# Patient Record
Sex: Male | Born: 1990
Health system: Southern US, Community
[De-identification: ages and names within clinical notes are randomized; demographics above are authoritative.]

## PROBLEM LIST (undated history)

## (undated) DIAGNOSIS — E119 Type 2 diabetes mellitus without complications: Secondary | ICD-10-CM

## (undated) DIAGNOSIS — I10 Essential (primary) hypertension: Secondary | ICD-10-CM

## (undated) DIAGNOSIS — M109 Gout, unspecified: Secondary | ICD-10-CM

## (undated) HISTORY — PX: TONSILLECTOMY: SUR1361

## (undated) HISTORY — PX: BONE CURRETTAGE W/ BONE GRAFT: SHX1248

## (undated) HISTORY — DX: Gout, unspecified: M10.9

## (undated) HISTORY — DX: Type 2 diabetes mellitus without complications: E11.9

---
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
## 2011-12-01 ENCOUNTER — Encounter: Payer: Self-pay | Admitting: *Deleted

## 2011-12-01 ENCOUNTER — Emergency Department (HOSPITAL_COMMUNITY): Payer: BC Managed Care – PPO

## 2011-12-01 ENCOUNTER — Emergency Department (HOSPITAL_COMMUNITY)
Admission: EM | Admit: 2011-12-01 | Discharge: 2011-12-02 | Disposition: A | Discharge status: Home / Self Care | Payer: BC Managed Care – PPO | Attending: Emergency Medicine | Admitting: Emergency Medicine

## 2011-12-01 DIAGNOSIS — M79609 Pain in unspecified limb: Secondary | ICD-10-CM | POA: Insufficient documentation

## 2011-12-01 DIAGNOSIS — S93409A Sprain of unspecified ligament of unspecified ankle, initial encounter: Secondary | ICD-10-CM

## 2011-12-01 DIAGNOSIS — IMO0002 Reserved for concepts with insufficient information to code with codable children: Secondary | ICD-10-CM | POA: Insufficient documentation

## 2011-12-01 DIAGNOSIS — Y9367 Activity, basketball: Secondary | ICD-10-CM | POA: Insufficient documentation

## 2011-12-01 NOTE — ED Notes (Signed)
Pt in c/o left foot pain after injury

## 2011-12-02 NOTE — ED Notes (Signed)
Pt given discharge instructions and verbalizes understanding  

## 2011-12-02 NOTE — ED Provider Notes (Signed)
History     CSN: [id number] Arrival date & time: 12/01/2011 10:08 PM   First MD Initiated Contact with Patient 12/02/11 0115      Chief Complaint  Patient presents with  . Foot Pain    (Consider location/radiation/quality/duration/timing/severity/associated sxs/prior treatment) HPI Comments: Patient was playing basketball today and somebody stepped on his foot and he fell the opposite direction with his ankle twisting. He's had pain and swelling on the lateral part of his left foot since. The pain is a 6/10. He is able to walk with a limp.  Patient is a 20 y.o. male presenting with lower extremity pain. The history is provided by the patient.  Foot Pain This is a new problem. The current episode started 3 to 5 hours ago. The problem occurs constantly. The problem has been gradually improving. The symptoms are aggravated by walking and standing. The symptoms are relieved by rest and position. He has tried nothing for the symptoms. The treatment provided mild relief.    History reviewed. No pertinent past medical history.  History reviewed. No pertinent past surgical history.  History reviewed. No pertinent family history.  History  Substance Use Topics  . Smoking status: Not on file  . Smokeless tobacco: Not on file  . Alcohol Use: Not on file      Review of Systems  All other systems reviewed and are negative.    Allergies  Review of patient's allergies indicates no known allergies.  Home Medications  No current outpatient prescriptions on file.  BP 152/97  Pulse 87  Temp(Src) 99.1 F (37.3 C) (Oral)  Resp 20  SpO2 96%  Physical Exam  Nursing note and vitals reviewed. Constitutional: He is oriented to person, place, and time. He appears well-developed and well-nourished. No distress.  HENT:  Head: Normocephalic and atraumatic.  Eyes: EOM are normal. Pupils are equal, round, and reactive to light.  Musculoskeletal:       Left ankle: He exhibits swelling.  He exhibits normal range of motion. tenderness. Lateral malleolus tenderness found. No head of 5th metatarsal and no proximal fibula tenderness found.       Feet:  Neurological: He is alert and oriented to person, place, and time.  Skin: Skin is warm and dry.  Psychiatric: He has a normal mood and affect. His behavior is normal.    ED Course  Procedures (including critical care time)  Labs Reviewed - No data to display Dg Foot Complete Left  12/01/2011  *RADIOLOGY REPORT*  Clinical Data: Injured left foot playing basketball  LEFT FOOT - COMPLETE 3+ VIEW  Comparison: None.  Findings: No fracture or dislocation is seen.  The joint spaces are preserved.  The visualized soft tissues are unremarkable.  IMPRESSION: No acute osseous abnormality is seen.  Original Report Authenticated By: Charline Bills, M.D.     No diagnosis found.    MDM   Patient with mechanical injury to the left ankle with some mild swelling on the lateral malleolar. Plain film negative for fracture. Will place an air splint and have him followup for worsening symptoms. Patient is able to bear weight with only a mild limp.        Gwyneth Sprout, MD 12/02/11 [phone]

## 2013-09-10 ENCOUNTER — Encounter (HOSPITAL_COMMUNITY): Payer: Self-pay | Admitting: *Deleted

## 2013-09-10 ENCOUNTER — Emergency Department (HOSPITAL_COMMUNITY)
Admission: EM | Admit: 2013-09-10 | Discharge: 2013-09-10 | Disposition: A | Discharge status: Home / Self Care | Payer: BC Managed Care – PPO | Attending: Emergency Medicine | Admitting: Emergency Medicine

## 2013-09-10 DIAGNOSIS — Y9239 Other specified sports and athletic area as the place of occurrence of the external cause: Secondary | ICD-10-CM | POA: Insufficient documentation

## 2013-09-10 DIAGNOSIS — S060X0A Concussion without loss of consciousness, initial encounter: Secondary | ICD-10-CM | POA: Insufficient documentation

## 2013-09-10 DIAGNOSIS — W219XXA Striking against or struck by unspecified sports equipment, initial encounter: Secondary | ICD-10-CM | POA: Insufficient documentation

## 2013-09-10 DIAGNOSIS — Y9361 Activity, american tackle football: Secondary | ICD-10-CM | POA: Insufficient documentation

## 2013-09-10 MED ORDER — IBUPROFEN 800 MG PO TABS
800.0000 mg | ORAL_TABLET | Freq: Once | ORAL | Status: AC
  Administered 2013-09-10: 800 mg via ORAL
  Filled 2013-09-10: qty 1

## 2013-09-10 NOTE — ED Provider Notes (Signed)
CSN: [id number]     Arrival date & time 09/10/13  2033 History  This chart was scribed for non-physician practitioner Ivonne Andrew, working with Nelia Shi, MD by Carl Best, ED Scribe. This patient was seen in room WTR8/WTR8 and the patient's care was started at 10:25 PM.    Chief Complaint  Patient presents with  . Head Injury    Patient is a 22 y.o. male presenting with head injury. The history is provided by the patient. No language interpreter was used.  Head Injury Associated symptoms: headache   Associated symptoms: no numbness    HPI Comments: Marco Torres is a 22 y.o. male who presents to the Emergency Department complaining of an intermittent headache located on his left temple.  He states that he "caught an elbow" to the left side of his head yesterday while playing football.  The patient denies loosing consciousness at the time of the incident.  The patient states that the left side of his head is swollen and the back of his head hurts.  He states that turning his head to the left aggravates the pain.  The patient states he feels slightly dizzy.  He denies weakness or numbness in his hands or feet, vision problems, trouble concentrating, confusion, and increased short term memory loss as associated symptoms. He denies taking any medication for his headache.  He denies having a history of concussions or head injuries.  History reviewed. No pertinent past medical history. Past Surgical History  Procedure Laterality Date  . Tonsillectomy     No family history on file. History  Substance Use Topics  . Smoking status: Never Smoker   . Smokeless tobacco: Not on file  . Alcohol Use: Yes    Review of Systems  Neurological: Positive for dizziness (slightly) and headaches. Negative for weakness, light-headedness and numbness.  Psychiatric/Behavioral: Negative for confusion and decreased concentration.  All other systems reviewed and are negative.    Allergies  Review  of patient's allergies indicates no known allergies.  Home Medications  No current outpatient prescriptions on file.  Triage Vitals: BP 155/93  Pulse 84  Temp(Src) 98.1 F (36.7 C) (Oral)  Wt 340 lb (154.223 kg)  SpO2 96%  Physical Exam  Nursing note and vitals reviewed. Constitutional: He is oriented to person, place, and time. He appears well-developed. No distress.  HENT:  Head: Normocephalic and atraumatic.  Right Ear: External ear normal.  Left Ear: External ear normal.  Nose: Nose normal.  Mouth/Throat: Oropharynx is clear and moist.  No Battle sign or raccoon eyes, tenderness along TMJ  Eyes: Conjunctivae and EOM are normal. Pupils are equal, round, and reactive to light.  Neck: Neck supple.  Cardiovascular: Normal rate, regular rhythm, normal heart sounds and intact distal pulses.   Pulmonary/Chest: Effort normal and breath sounds normal. No respiratory distress.  Musculoskeletal: Normal range of motion. He exhibits no edema and no tenderness.  Non tender to palpation along the cervical spine, TTP along the trapezius, no skull depressions or step-offs   Neurological: He is alert and oriented to person, place, and time.  Normal strength.  Skin: Skin is warm and dry. He is not diaphoretic.  Psychiatric: He has a normal mood and affect.    ED Course  Procedures   DIAGNOSTIC STUDIES: Oxygen Saturation is 96% on room air, adequate by my interpretation.    COORDINATION OF CARE: 10:41 PM- Discussed administering pain medication in the ED.  Advised the patient to take Ibuprofen or  Tylenol and relax to alleviate the headache and gradually return to playing sports.  The patient agreed to the treatment plan.    Medications  ibuprofen (ADVIL,MOTRIN) tablet 800 mg (not administered)      MDM   1. Headache   2. Concussion without loss of consciousness, initial encounter    Patient seen and evaluated. Patient well with normal nonfocal neuro exam. No signs of significant  trauma to the head or face. Patient may have continued headache from mild concussion symptoms.  I personally performed the services described in this documentation, which was scribed in my presence. The recorded information has been reviewed and is accurate.   Angus Seller, PA-C 09/11/13 [phone]

## 2013-09-10 NOTE — ED Notes (Signed)
Playing football yesterday, "caught elbow to left side of head" No LOC, Intermittent throbbing on and off with pain. Pain shoots at times

## 2013-09-22 NOTE — ED Provider Notes (Signed)
Medical screening examination/treatment/procedure(s) were performed by non-physician practitioner and as supervising physician I was immediately available for consultation/collaboration.    Nelia Shi, MD 09/22/13 [phone]

## 2013-10-06 ENCOUNTER — Other Ambulatory Visit: Payer: Self-pay | Admitting: Internal Medicine

## 2013-10-10 ENCOUNTER — Other Ambulatory Visit: Payer: BC Managed Care – PPO

## 2013-10-16 ENCOUNTER — Other Ambulatory Visit: Payer: BC Managed Care – PPO

## 2013-10-18 ENCOUNTER — Ambulatory Visit
Admission: RE | Admit: 2013-10-18 | Discharge: 2013-10-18 | Disposition: A | Discharge status: Home / Self Care | Payer: BC Managed Care – PPO | Source: Ambulatory Visit | Attending: Internal Medicine | Admitting: Internal Medicine

## 2013-10-18 ENCOUNTER — Other Ambulatory Visit: Payer: Self-pay | Admitting: Internal Medicine

## 2015-11-10 ENCOUNTER — Emergency Department (HOSPITAL_COMMUNITY)
Admission: EM | Admit: 2015-11-10 | Discharge: 2015-11-10 | Disposition: A | Discharge status: Home / Self Care | Payer: 59 | Attending: Emergency Medicine | Admitting: Emergency Medicine

## 2015-11-10 ENCOUNTER — Encounter (HOSPITAL_COMMUNITY): Payer: Self-pay | Admitting: *Deleted

## 2015-11-10 DIAGNOSIS — H9201 Otalgia, right ear: Secondary | ICD-10-CM | POA: Diagnosis present

## 2015-11-10 DIAGNOSIS — H6691 Otitis media, unspecified, right ear: Secondary | ICD-10-CM | POA: Insufficient documentation

## 2015-11-10 DIAGNOSIS — Z79899 Other long term (current) drug therapy: Secondary | ICD-10-CM | POA: Diagnosis not present

## 2015-11-10 MED ORDER — AMOXICILLIN 500 MG PO TABS: 500.0000 mg | ORAL_TABLET | Freq: Two times a day (BID) | ORAL | Status: AC

## 2015-11-10 MED ORDER — IBUPROFEN 600 MG PO TABS: 600.0000 mg | ORAL_TABLET | Freq: Four times a day (QID) | ORAL | Status: DC | PRN

## 2015-11-10 MED ORDER — IBUPROFEN 200 MG PO TABS
600.0000 mg | ORAL_TABLET | Freq: Once | ORAL | Status: AC
  Administered 2015-11-10: 600 mg via ORAL
  Filled 2015-11-10: qty 3

## 2015-11-10 NOTE — ED Provider Notes (Signed)
CSN: [id number]     Arrival date & time 11/10/15  0146 History   First MD Initiated Contact with Patient 11/10/15 0602     Chief Complaint  Patient presents with  . Otalgia     (Consider location/radiation/quality/duration/timing/severity/associated sxs/prior Treatment) HPI Comments: SUBJECTIVE: Marco Torres is a 24 y.o. male brought by mother with 1 day(s) history of pain and pulling at right ear, and congestion, post nasal drip and right ear no fever, no drainage, no hearing loss or ringing in the ear. Temperature not measured at home. No trismus. No toothache.    Patient is a 24 y.o. male presenting with ear pain. The history is provided by the patient.  Otalgia Associated symptoms: no hearing loss     History reviewed. No pertinent past medical history. Past Surgical History  Procedure Laterality Date  . Tonsillectomy     No family history on file. Social History  Substance Use Topics  . Smoking status: Never Smoker   . Smokeless tobacco: None  . Alcohol Use: Yes    Review of Systems  HENT: Positive for ear pain. Negative for facial swelling, hearing loss and nosebleeds.       Allergies  Review of patient's allergies indicates no known allergies.  Home Medications   Prior to Admission medications   Medication Sig Start Date End Date Taking? Authorizing Provider  Multiple Vitamins-Minerals (EMERGEN-C VITAMIN C PO) Take 1 packet by mouth daily.   Yes Historical Provider, MD  amoxicillin (AMOXIL) 500 MG tablet Take 1 tablet (500 mg total) by mouth 2 (two) times daily. 11/10/15   Derwood KaplanAnkit Kendalynn Wideman, MD  ibuprofen (ADVIL,MOTRIN) 600 MG tablet Take 1 tablet (600 mg total) by mouth every 6 (six) hours as needed. 11/10/15   Renesmae Donahey, MD   BP 157/96 mmHg  Pulse 90  Temp(Src) 98.4 F (36.9 C) (Oral)  Resp 16  SpO2 98% Physical Exam  Constitutional: He is oriented to person, place, and time. He appears well-developed.  HENT:  Head: Atraumatic.  Mouth/Throat:  No oropharyngeal exudate.  The ear exam is limited due to the topical drops patient placed. However, there is no pre/post auricular lymphadenopathy and there is mild erythema in the canal.  Neck: Neck supple.  Cardiovascular: Normal rate.   Pulmonary/Chest: Effort normal.  Neurological: He is alert and oriented to person, place, and time.  Skin: Skin is warm.    ED Course  Procedures (including critical care time) Labs Review Labs Reviewed - No data to display  Imaging Review No results found. I have personally reviewed and evaluated these images and lab results as part of my medical decision-making.   EKG Interpretation None      MDM   Final diagnoses:  Acute right otitis media, recurrence not specified, unspecified otitis media type    ASSESSMENT: Otitis Media  PLAN: 1) See orders for this visit as documented in the electronic medical record. 2) Symptomatic therapy suggested: use ibuprofen prn.  3) Call or return to clinic prn if these symptoms worsen or fail to improve as anticipated.   Derwood KaplanAnkit Aliza Moret, MD 11/10/15 [phone]

## 2015-11-10 NOTE — ED Notes (Signed)
Pt reports he feels like he has pressure in Rt ear that started this evening around 7pm.

## 2015-11-10 NOTE — Discharge Instructions (Signed)
Take the antibiotics only if the pain is not getting better or you have fevers. Otherwise, take the motrin.  Please return to the ER if your symptoms worsen; you have increased pain, confusion, headaches. Otherwise see the outpatient doctor as requested.   Otitis Media, Adult Otitis media is redness, soreness, and inflammation of the middle ear. Otitis media may be caused by allergies or, most commonly, by infection. Often it occurs as a complication of the common cold. SIGNS AND SYMPTOMS Symptoms of otitis media may include:  Earache.  Fever.  Ringing in your ear.  Headache.  Leakage of fluid from the ear. DIAGNOSIS To diagnose otitis media, your health care provider will examine your ear with an otoscope. This is an instrument that allows your health care provider to see into your ear in order to examine your eardrum. Your health care provider also will ask you questions about your symptoms. TREATMENT  Typically, otitis media resolves on its own within 3-5 days. Your health care provider may prescribe medicine to ease your symptoms of pain. If otitis media does not resolve within 5 days or is recurrent, your health care provider may prescribe antibiotic medicines if he or she suspects that a bacterial infection is the cause. HOME CARE INSTRUCTIONS   If you were prescribed an antibiotic medicine, finish it all even if you start to feel better.  Take medicines only as directed by your health care provider.  Keep all follow-up visits as directed by your health care provider. SEEK MEDICAL CARE IF:  You have otitis media only in one ear, or bleeding from your nose, or both.  You notice a lump on your neck.  You are not getting better in 3-5 days.  You feel worse instead of better. SEEK IMMEDIATE MEDICAL CARE IF:   You have pain that is not controlled with medicine.  You have swelling, redness, or pain around your ear or stiffness in your neck.  You notice that part of your  face is paralyzed.  You notice that the bone behind your ear (mastoid) is tender when you touch it. MAKE SURE YOU:   Understand these instructions.  Will watch your condition.  Will get help right away if you are not doing well or get worse.   This information is not intended to replace advice given to you by your health care provider. Make sure you discuss any questions you have with your health care provider.   Document Released: 09/04/2004 Document Revised: 12/21/2014 Document Reviewed: 06/27/2013 Elsevier Interactive Patient Education [employer]2016 [employer]

## 2016-04-02 ENCOUNTER — Ambulatory Visit: Payer: 59 | Admitting: Podiatry

## 2016-04-21 ENCOUNTER — Ambulatory Visit: Payer: 59 | Admitting: Podiatry

## 2016-04-28 ENCOUNTER — Ambulatory Visit: Payer: 59 | Admitting: Podiatry

## 2016-05-14 ENCOUNTER — Ambulatory Visit: Payer: 59 | Admitting: Podiatry

## 2016-11-21 DIAGNOSIS — I10 Essential (primary) hypertension: Secondary | ICD-10-CM | POA: Insufficient documentation

## 2017-08-25 ENCOUNTER — Encounter (HOSPITAL_COMMUNITY): Payer: Self-pay

## 2017-08-25 ENCOUNTER — Emergency Department (HOSPITAL_COMMUNITY)
Admission: EM | Admit: 2017-08-25 | Discharge: 2017-08-25 | Disposition: A | Discharge status: Home / Self Care | Payer: BLUE CROSS/BLUE SHIELD | Attending: Emergency Medicine | Admitting: Emergency Medicine

## 2017-08-25 DIAGNOSIS — H6122 Impacted cerumen, left ear: Secondary | ICD-10-CM | POA: Insufficient documentation

## 2017-08-25 DIAGNOSIS — H6592 Unspecified nonsuppurative otitis media, left ear: Secondary | ICD-10-CM

## 2017-08-25 DIAGNOSIS — H748X3 Other specified disorders of middle ear and mastoid, bilateral: Secondary | ICD-10-CM | POA: Diagnosis not present

## 2017-08-25 DIAGNOSIS — H9202 Otalgia, left ear: Secondary | ICD-10-CM | POA: Diagnosis present

## 2017-08-25 MED ORDER — TRIAMCINOLONE ACETONIDE 55 MCG/ACT NA AERO: 2.0000 | INHALATION_SPRAY | Freq: Every day | NASAL | 12 refills | Status: AC

## 2017-08-25 MED ORDER — IBUPROFEN 800 MG PO TABS
800.0000 mg | ORAL_TABLET | Freq: Once | ORAL | Status: AC
  Administered 2017-08-25: 800 mg via ORAL
  Filled 2017-08-25: qty 1

## 2017-08-25 MED ORDER — IBUPROFEN 600 MG PO TABS: 600.0000 mg | ORAL_TABLET | Freq: Four times a day (QID) | ORAL | 0 refills | Status: DC | PRN

## 2017-08-25 MED ORDER — CETIRIZINE HCL 10 MG PO CAPS: 10.0000 mg | ORAL_CAPSULE | Freq: Every day | ORAL | 0 refills | Status: AC

## 2017-08-25 NOTE — ED Triage Notes (Addendum)
Pt reporting L sided otalgia that started around 830 pm last night. He states that the pain radiates down and around his jaw as well.  He denies N/V/D. A&Ox4. Ambulatory. Pt sounds nasally congested throughout triage.

## 2017-08-25 NOTE — ED Notes (Signed)
Bed: [facility] Expected date:  Expected time:  Means of arrival:  Comments: 

## 2017-08-25 NOTE — ED Provider Notes (Addendum)
[facility]-EMERGENCY DEPT Provider Note   CSN: [id number] Arrival date & time: 08/25/17  0251     History   Chief Complaint Chief Complaint  Patient presents with  . Otalgia    L    HPI Marco Torres is a 26 y.o. male.  Patient presents with gradual onset of left ear pain last night. No drainage or bleeding from the ear. No recurrent ear infections. No fever, nausea, dizziness, sore throat. No foreign bodies inserted into the ear prior to onset of pain. He denies new nasal congestion or sinus pressure. He reports he has those symptoms chronically.   The history is provided by the patient. No language interpreter was used.    History reviewed. No pertinent past medical history.  There are no active problems to display for this patient.   Past Surgical History:  Procedure Laterality Date  . TONSILLECTOMY         Home Medications    Prior to Admission medications   Medication Sig Start Date End Date Taking? Authorizing Provider  amoxicillin (AMOXIL) 500 MG tablet Take 1 tablet (500 mg total) by mouth 2 (two) times daily. 11/10/15   Derwood Kaplan, MD  ibuprofen (ADVIL,MOTRIN) 600 MG tablet Take 1 tablet (600 mg total) by mouth every 6 (six) hours as needed. 11/10/15   Derwood Kaplan, MD  Multiple Vitamins-Minerals (EMERGEN-C VITAMIN C PO) Take 1 packet by mouth daily.    [provider]    Family History History reviewed. No pertinent family history.  Social History Social History  Substance Use Topics  . Smoking status: Never Smoker  . Smokeless tobacco: Not on file  . Alcohol use Yes     Allergies   Patient has no known allergies.   Review of Systems Review of Systems  Constitutional: Negative for fever.  HENT: Positive for congestion and ear pain. Negative for ear discharge, sore throat, trouble swallowing and voice change.   Respiratory: Negative for cough.   Gastrointestinal: Negative for nausea.  Neurological: Negative for dizziness.      Physical Exam Updated Vital Signs BP (!) 163/90 (BP Location: Right Arm)   Pulse (!) 106   Temp 98.1 F (36.7 C) (Oral)   Resp 18   SpO2 96%   Physical Exam  Constitutional: He is oriented to person, place, and time. He appears well-developed and well-nourished.  HENT:  Mouth/Throat: Oropharynx is clear and moist.  Right ear and ear canal are unremarkable. Left ear canal occluded with cerumen.  Post ear lavage of left ear: canal is red and minimally swollen. Middle ear effusion present without significant redness of the TM.  Neck: Normal range of motion.  Pulmonary/Chest: Effort normal.  Musculoskeletal: Normal range of motion.  Neurological: He is alert and oriented to person, place, and time.  Skin: Skin is warm and dry.  Psychiatric: He has a normal mood and affect.     ED Treatments / Results  Labs (all labs ordered are listed, but only abnormal results are displayed) Labs Reviewed - No data to display  EKG  EKG Interpretation None       Radiology No results found.  Procedures .Ear Cerumen Removal Date/Time: 09/06/2017 10:32 PM Performed by: Elpidio Anis Authorized by: Elpidio Anis   Consent:    Consent obtained:  Verbal   Consent given by:  Patient Procedure details:    Location:  L ear   Procedure type: irrigation   Post-procedure details:    Hearing quality:  Improved  Patient tolerance of procedure:  Tolerated well, no immediate complications   (including critical care time)  Medications Ordered in ED Medications - No data to display   Initial Impression / Assessment and Plan / ED Course  I have reviewed the triage vital signs and the nursing notes.  Pertinent labs & imaging results that were available during my care of the patient were reviewed by me and considered in my medical decision making (see chart for details).     Patient with ear pain that started last night. On exam, post-lavage, there is an effusion without evidence  infection. No fever, redness. He has chronic sinus and nasal congestion.   Will start on Zyrtec, Nasacort and recommend plain saline nasal spray. Follow up with PCP is pain persists.   Final Clinical Impressions(s) / ED Diagnoses   Final diagnoses:  None   1. Left middle ear effusion 2. Left cerumen impaction  New Prescriptions New Prescriptions   No medications on file     Elpidio AnisUpstill, Charma Mocarski, Cordelia Poche-C 08/25/17 [id number]    Ward, Layla MawKristen N, DO 08/25/17 0535    Elpidio AnisUpstill, Naydene Kamrowski, PA-C 09/06/17 2232    Ward, Layla MawKristen N, DO 09/09/17 0004

## 2017-08-25 NOTE — Discharge Instructions (Signed)
Recommend daily use of Zyrtec and nasacort. Also recommend plain saline nasal spray and ibuprofen as directed.

## 2017-09-04 ENCOUNTER — Encounter (HOSPITAL_BASED_OUTPATIENT_CLINIC_OR_DEPARTMENT_OTHER): Payer: Self-pay | Admitting: *Deleted

## 2017-09-04 ENCOUNTER — Emergency Department (HOSPITAL_BASED_OUTPATIENT_CLINIC_OR_DEPARTMENT_OTHER)
Admission: EM | Admit: 2017-09-04 | Discharge: 2017-09-04 | Disposition: A | Discharge status: Home / Self Care | Payer: BLUE CROSS/BLUE SHIELD | Attending: Emergency Medicine | Admitting: Emergency Medicine

## 2017-09-04 DIAGNOSIS — R0981 Nasal congestion: Secondary | ICD-10-CM | POA: Diagnosis not present

## 2017-09-04 DIAGNOSIS — Z79899 Other long term (current) drug therapy: Secondary | ICD-10-CM | POA: Insufficient documentation

## 2017-09-04 DIAGNOSIS — H9202 Otalgia, left ear: Secondary | ICD-10-CM

## 2017-09-04 MED ORDER — FLUTICASONE PROPIONATE 50 MCG/ACT NA SUSP: 2.0000 | Freq: Every day | NASAL | 0 refills | Status: AC

## 2017-09-04 MED ORDER — IBUPROFEN 800 MG PO TABS
800.0000 mg | ORAL_TABLET | Freq: Once | ORAL | Status: AC
  Administered 2017-09-04: 800 mg via ORAL
  Filled 2017-09-04: qty 1

## 2017-09-04 MED ORDER — OXYMETAZOLINE HCL 0.05 % NA SOLN
1.0000 | Freq: Once | NASAL | Status: AC
  Administered 2017-09-04: 1 via NASAL
  Filled 2017-09-04: qty 15

## 2017-09-04 MED ORDER — IBUPROFEN 400 MG PO TABS: 400.0000 mg | ORAL_TABLET | Freq: Four times a day (QID) | ORAL | 0 refills | Status: DC | PRN

## 2017-09-04 NOTE — ED Triage Notes (Signed)
C/o left ear pain x 4 hours,  Has been using drops that was given 10 days ago

## 2017-09-04 NOTE — ED Provider Notes (Addendum)
[facility] Provider [facility]   CSN: [id number] Arrival date & time: 09/04/17  0341     History   Chief Complaint Chief Complaint  Patient presents with  . Otalgia    HPI Marco Torres is a 26 y.o. male.  The history is provided by the patient.  Otalgia  This is a new problem. The current episode started more than 1 week ago. There is pain in the left ear. The problem occurs constantly. The problem has not changed since onset.There has been no fever. The pain is moderate. Pertinent negatives include no ear discharge, no rhinorrhea and no vomiting. His past medical history does not include chronic ear infection.  Seen in ED for same and started on drops and followed up with PMD who continued drops and started tramadol but it is not getting better  History reviewed. No pertinent past medical history.  There are no active problems to display for this patient.   Past Surgical History:  Procedure Laterality Date  . TONSILLECTOMY         Home Medications    Prior to Admission medications   Medication Sig Start Date End Date Taking? Authorizing Provider  amoxicillin (AMOXIL) 500 MG tablet Take 1 tablet (500 mg total) by mouth 2 (two) times daily. 11/10/15   Derwood Kaplan, MD  Cetirizine HCl 10 MG CAPS Take 1 capsule (10 mg total) by mouth daily. 08/25/17   Elpidio Anis, PA-C  fluticasone (FLONASE) 50 MCG/ACT nasal spray Place 2 sprays into both nostrils daily. 09/04/17   Blanch Stang, MD  ibuprofen (ADVIL,MOTRIN) 400 MG tablet Take 1 tablet (400 mg total) by mouth every 6 (six) hours as needed. 09/04/17   Cynthie Garmon, MD  Multiple Vitamins-Minerals (EMERGEN-C VITAMIN C PO) Take 1 packet by mouth daily.    [provider]  triamcinolone (NASACORT) 55 MCG/ACT AERO nasal inhaler Place 2 sprays into the nose daily. 08/25/17   Elpidio Anis, PA-C    Family History No family history on file.  Social History Social History  Substance Use Topics  .  Smoking status: Never Smoker  . Smokeless tobacco: Never Used  . Alcohol use Yes     Allergies   Patient has no known allergies.   Review of Systems Review of Systems  Constitutional: Negative for fever.  HENT: Positive for congestion and ear pain. Negative for drooling, ear discharge, postnasal drip and rhinorrhea.   Gastrointestinal: Negative for vomiting.  All other systems reviewed and are negative.    Physical Exam Updated Vital Signs BP (!) 170/104 (BP Location: Right Arm)   Pulse 90   Temp 98.1 F (36.7 C) (Oral)   Resp 18   Ht  (1.905 m)   Wt (!) 186 kg (410 lb)   SpO2 97%   BMI 51.25 kg/m   Physical Exam  Constitutional: He is oriented to person, place, and time. He appears well-developed and well-nourished.  HENT:  Head: Normocephalic and atraumatic.  Right Ear: External ear normal. No mastoid tenderness. Tympanic membrane is not injected, not scarred, not perforated, not erythematous, not retracted and not bulging. No hemotympanum.  Left Ear: External ear normal. No mastoid tenderness. Tympanic membrane is not injected, not scarred, not perforated, not erythematous, not retracted and not bulging. No hemotympanum.  Nose: Nose normal.  Mouth/Throat: Oropharynx is clear and moist. No oropharyngeal exudate.  Eyes: Pupils are equal, round, and reactive to light. Conjunctivae are normal.  Neck: Normal range of motion. Neck supple.  Cardiovascular:  Normal rate, regular rhythm, normal heart sounds and intact distal pulses.   Pulmonary/Chest: Effort normal and breath sounds normal. No stridor. No respiratory distress. He has no wheezes. He has no rales.  Abdominal: Soft. Bowel sounds are normal. He exhibits no mass. There is no tenderness. There is no rebound and no guarding.  Musculoskeletal: Normal range of motion.  Neurological: He is alert and oriented to person, place, and time.  Skin: Skin is warm and dry. Capillary refill takes less than 2 seconds.    Psychiatric: He has a normal mood and affect.     ED Treatments / Results  Labs (all labs ordered are listed, but only abnormal results are displayed) Labs Reviewed - No data to display  EKG  EKG Interpretation None       Radiology No results found.  Procedures Procedures (including critical care time)  Medications Ordered in ED Medications  oxymetazoline (AFRIN) 0.05 % nasal spray 1 spray (1 spray Each Nare Given 09/04/17 0524)  ibuprofen (ADVIL,MOTRIN) tablet 800 mg (800 mg Oral Given 09/04/17 0524)       Final Clinical Impressions(s) / ED Diagnoses   Final diagnoses:  Otalgia of left ear   Suspect it is coming from nasal congestion.  Will start flonase and NSAIDs and have patient follow up with ENT.  No signs of infection.    Strict return precautions given for  Shortness of breath, swelling or the lips or tongue, chest pain, dyspnea on exertion, new weakness or numbness changes in vision or speech,  Inability to tolerate liquids or food, changes in voice cough, altered mental status or any concerns. No signs of systemic illness or infection. The patient is nontoxic-appearing on exam and vital signs are within normal limits.    I have reviewed the triage vital signs and the nursing notes. Pertinent labs &imaging results that were available during my care of the patient were reviewed by me and considered in my medical decision making (see chart for details).  After history, exam, and medical workup I feel the patient has been appropriately medically screened and is safe for discharge home. Pertinent diagnoses were discussed with the patient. Patient was given return precautions.  New Prescriptions Discharge Medication List as of 09/04/2017  5:59 AM    START taking these medications   Details  fluticasone (FLONASE) 50 MCG/ACT nasal spray Place 2 sprays into both nostrils daily., Starting Sat 09/04/2017, Print         Jobie Popp, MD 09/04/17 6578     Cy Blamer, MD 09/04/17 4696

## 2017-09-09 DIAGNOSIS — H6982 Other specified disorders of Eustachian tube, left ear: Secondary | ICD-10-CM | POA: Insufficient documentation

## 2017-09-09 DIAGNOSIS — H9012 Conductive hearing loss, unilateral, left ear, with unrestricted hearing on the contralateral side: Secondary | ICD-10-CM | POA: Insufficient documentation

## 2017-09-09 DIAGNOSIS — H9202 Otalgia, left ear: Secondary | ICD-10-CM | POA: Insufficient documentation

## 2018-01-08 ENCOUNTER — Emergency Department (HOSPITAL_BASED_OUTPATIENT_CLINIC_OR_DEPARTMENT_OTHER)
Admission: EM | Admit: 2018-01-08 | Discharge: 2018-01-08 | Disposition: A | Discharge status: Home / Self Care | Payer: Worker's Compensation | Attending: Emergency Medicine | Admitting: Emergency Medicine

## 2018-01-08 ENCOUNTER — Encounter (HOSPITAL_BASED_OUTPATIENT_CLINIC_OR_DEPARTMENT_OTHER): Payer: Self-pay | Admitting: Emergency Medicine

## 2018-01-08 ENCOUNTER — Other Ambulatory Visit: Payer: Self-pay

## 2018-01-08 ENCOUNTER — Emergency Department (HOSPITAL_BASED_OUTPATIENT_CLINIC_OR_DEPARTMENT_OTHER): Payer: Worker's Compensation

## 2018-01-08 DIAGNOSIS — S9032XA Contusion of left foot, initial encounter: Secondary | ICD-10-CM | POA: Diagnosis not present

## 2018-01-08 DIAGNOSIS — Y9301 Activity, walking, marching and hiking: Secondary | ICD-10-CM | POA: Insufficient documentation

## 2018-01-08 DIAGNOSIS — I10 Essential (primary) hypertension: Secondary | ICD-10-CM

## 2018-01-08 DIAGNOSIS — Y990 Civilian activity done for income or pay: Secondary | ICD-10-CM | POA: Diagnosis not present

## 2018-01-08 DIAGNOSIS — Y9289 Other specified places as the place of occurrence of the external cause: Secondary | ICD-10-CM | POA: Diagnosis not present

## 2018-01-08 DIAGNOSIS — W010XXA Fall on same level from slipping, tripping and stumbling without subsequent striking against object, initial encounter: Secondary | ICD-10-CM | POA: Diagnosis not present

## 2018-01-08 DIAGNOSIS — M25562 Pain in left knee: Secondary | ICD-10-CM | POA: Insufficient documentation

## 2018-01-08 DIAGNOSIS — W19XXXA Unspecified fall, initial encounter: Secondary | ICD-10-CM

## 2018-01-08 DIAGNOSIS — Z79899 Other long term (current) drug therapy: Secondary | ICD-10-CM | POA: Diagnosis not present

## 2018-01-08 DIAGNOSIS — S99922A Unspecified injury of left foot, initial encounter: Secondary | ICD-10-CM | POA: Diagnosis present

## 2018-01-08 HISTORY — DX: Essential (primary) hypertension: I10

## 2018-01-08 MED ORDER — NAPROXEN 250 MG PO TABS: 250.0000 mg | ORAL_TABLET | Freq: Two times a day (BID) | ORAL | 0 refills | Status: DC

## 2018-01-08 NOTE — ED Provider Notes (Signed)
[facility] EMERGENCY DEPARTMENT Provider Note   CSN: [id number] Arrival date & time: 01/08/18  1710     History   Chief Complaint Chief Complaint  Patient presents with  . Fall    HPI Marco Torres is a 27 y.o. male.  Marco Torres is a 27 y.o. Male who presents to the ED complaining of left knee and foot pain after a slip and fall earlier today.  She reports he was at work today when he slipped on some ice and twisted his left knee and injured his left foot.  He denies other injury or trauma.  He denies hitting his head or loss of consciousness.  He reports is been ambulatory since the fall.  No treatments attempted prior to arrival.  He complains of pain around the lateral aspect of his left foot as well as to the anterior aspect of his left knee.  No fevers, numbness, tingling, weakness, head injury or loss of consciousness.   The history is provided by the patient and medical records. No language interpreter was used.  Fall     Past Medical History:  Diagnosis Date  . Hypertension     There are no active problems to display for this patient.   Past Surgical History:  Procedure Laterality Date  . TONSILLECTOMY         Home Medications    Prior to Admission medications   Medication Sig Start Date End Date Taking? Authorizing Provider  lisinopril (PRINIVIL,ZESTRIL) 20 MG tablet Take 20 mg by mouth daily.   Yes [provider]  amoxicillin (AMOXIL) 500 MG tablet Take 1 tablet (500 mg total) by mouth 2 (two) times daily. 11/10/15   Derwood Kaplan, MD  Cetirizine HCl 10 MG CAPS Take 1 capsule (10 mg total) by mouth daily. 08/25/17   Elpidio Anis, PA-C  fluticasone (FLONASE) 50 MCG/ACT nasal spray Place 2 sprays into both nostrils daily. 09/04/17   Palumbo, April, MD  Multiple Vitamins-Minerals (EMERGEN-C VITAMIN C PO) Take 1 packet by mouth daily.    [provider]  naproxen (NAPROSYN) 250 MG tablet Take 1 tablet (250 mg total) by  mouth 2 (two) times daily with a meal. 01/08/18   Everlene Farrier, PA-C  triamcinolone (NASACORT) 55 MCG/ACT AERO nasal inhaler Place 2 sprays into the nose daily. 08/25/17   Elpidio Anis, PA-C    Family History No family history on file.  Social History Social History   Tobacco Use  . Smoking status: Never Smoker  . Smokeless tobacco: Never Used  Substance Use Topics  . Alcohol use: Yes  . Drug use: No     Allergies   Patient has no known allergies.   Review of Systems Review of Systems  Constitutional: Negative for fever.  Musculoskeletal: Positive for arthralgias. Negative for back pain and gait problem.  Skin: Negative for rash and wound.  Neurological: Negative for syncope, weakness and numbness.     Physical Exam Updated Vital Signs BP (!) 177/102 (BP Location: Left Arm)   Pulse 99   Temp 98.3 F (36.8 C) (Oral)   Resp 20   Ht 6\' 3"  (1.905 m)   Wt (!) 186 kg (410 lb)   SpO2 96%   BMI 51.25 kg/m   Physical Exam  Constitutional: He appears well-developed and well-nourished. No distress.  HENT:  Head: Normocephalic and atraumatic.  Eyes: Right eye exhibits no discharge. Left eye exhibits no discharge.  Cardiovascular: Normal rate, regular rhythm and intact distal  pulses.  Bilateral dorsalis pedis and posterior tibialis pulses are intact.  Pulmonary/Chest: Effort normal. No respiratory distress.  Musculoskeletal: Normal range of motion. He exhibits tenderness. He exhibits no edema or deformity.  Mild tenderness along the lateral aspect of his left foot.  No overlying skin changes.  No deformity or crepitus.  Able to move his left toes without difficulty.  No ankle pain or instability noted.  No tenderness along his left shin.  Mild tenderness to the anterior aspect of his left knee.  Good range of motion of his left knee.  No knee instability noted.  No ecchymosis or edema noted.  Neurological: He is alert. No sensory deficit. He exhibits normal muscle tone.  Coordination normal.  Skin: Skin is warm and dry. Capillary refill takes less than 2 seconds. No rash noted. He is not diaphoretic. No erythema. No pallor.  Psychiatric: He has a normal mood and affect. His behavior is normal.  Nursing note and vitals reviewed.    ED Treatments / Results  Labs (all labs ordered are listed, but only abnormal results are displayed) Labs Reviewed - No data to display  EKG  EKG Interpretation None       Radiology Dg Knee Complete 4 Views Left  Result Date: 01/08/2018 CLINICAL DATA:  Fall with knee pain EXAM: LEFT KNEE - COMPLETE 4+ VIEW COMPARISON:  None. FINDINGS: Mild spurring of the superior and inferior patella. Trace knee effusion. No acute displaced fracture or malalignment. IMPRESSION: No acute osseous abnormality. Electronically Signed   By: Jasmine PangKim  Fujinaga M.D.   On: 01/08/2018 18:38   Dg Foot Complete Left  Result Date: 01/08/2018 CLINICAL DATA:  Fall with pain EXAM: LEFT FOOT - COMPLETE 3+ VIEW COMPARISON:  12/01/2011 FINDINGS: There is no evidence of fracture or dislocation. There is no evidence of arthropathy or other focal bone abnormality. Soft tissues are unremarkable. Small plantar calcaneal spur. IMPRESSION: No acute osseous abnormality. Electronically Signed   By: Jasmine PangKim  Fujinaga M.D.   On: 01/08/2018 18:37    Procedures Procedures (including critical care time)  Medications Ordered in ED Medications - No data to display   Initial Impression / Assessment and Plan / ED Course  I have reviewed the triage vital signs and the nursing notes.  Pertinent labs & imaging results that were available during my care of the patient were reviewed by me and considered in my medical decision making (see chart for details).    This  is a 27 y.o. Male who presents to the ED complaining of left knee and foot pain after a slip and fall earlier today.  She reports he was at work today when he slipped on some ice and twisted his left knee and injured  his left foot.  He denies other injury or trauma.  He denies hitting his head or loss of consciousness.  He reports is been ambulatory since the fall.  No treatments attempted prior to arrival.  He complains of pain around the lateral aspect of his left foot as well as to the anterior aspect of his left knee.  On exam the patient is afebrile nontoxic-appearing.  He has some mild tenderness along the lateral aspect of his left foot as well as anterior aspect of his left knee.  Good range of motion and he is neurovascularly intact.  X-rays are unremarkable. I offered Ace bandage and crutches.  Patient declines crutches.  He will take a knee sleeve.  Naproxen and ice and elevation for pain control.  Patient also noted to have elevated blood pressure.  He has a history of hypertension.  He was encouraged to follow-up with primary care for recheck of blood pressure. I advised the patient to follow-up with their primary care provider this week. I advised the patient to return to the emergency department with new or worsening symptoms or new concerns. The patient verbalized understanding and agreement with plan.     Final Clinical Impressions(s) / ED Diagnoses   Final diagnoses:  Acute pain of left knee  Fall, initial encounter  Essential hypertension  Contusion of left foot, initial encounter    ED Discharge Orders        Ordered    naproxen (NAPROSYN) 250 MG tablet  2 times daily with meals     01/08/18 2022       Everlene Farrier, PA-C 01/08/18 2029    Doug Sou, MD 01/09/18 [phone]

## 2018-01-08 NOTE — Discharge Instructions (Signed)
Please follow up with primary care to recheck your blood pressure. If your foot and knee pain persists please follow up with primary care for recheck.

## 2018-01-08 NOTE — ED Notes (Signed)
Pt given d/c instructions as per chart. Rx x 1. Verbalizes understanding. No questions. 

## 2018-01-08 NOTE — ED Notes (Signed)
Pt states he slipped on ice this am at work. C/o pain in left knee and lateral left foot. Reports he was ambulatory today at work. Unsure if UDS needed for workers comp. Pt contacting his supervisor to check

## 2018-01-08 NOTE — ED Notes (Signed)
Knee sleeve found to be too small. Ace wrap applied.

## 2018-01-08 NOTE — ED Triage Notes (Signed)
Pt slipped on ice this morning. Pt c/o L knee and L foot pain.

## 2018-09-18 ENCOUNTER — Emergency Department (HOSPITAL_COMMUNITY)
Admission: EM | Admit: 2018-09-18 | Discharge: 2018-09-19 | Disposition: A | Discharge status: Home / Self Care | Payer: Self-pay | Attending: Emergency Medicine | Admitting: Emergency Medicine

## 2018-09-18 ENCOUNTER — Encounter (HOSPITAL_COMMUNITY): Payer: Self-pay | Admitting: Nurse Practitioner

## 2018-09-18 ENCOUNTER — Emergency Department (HOSPITAL_COMMUNITY): Payer: Self-pay

## 2018-09-18 DIAGNOSIS — I10 Essential (primary) hypertension: Secondary | ICD-10-CM | POA: Insufficient documentation

## 2018-09-18 DIAGNOSIS — M25561 Pain in right knee: Secondary | ICD-10-CM | POA: Insufficient documentation

## 2018-09-18 DIAGNOSIS — Z79899 Other long term (current) drug therapy: Secondary | ICD-10-CM | POA: Insufficient documentation

## 2018-09-18 MED ORDER — NAPROXEN 500 MG PO TABS: 500.0000 mg | ORAL_TABLET | Freq: Two times a day (BID) | ORAL | 0 refills | Status: DC

## 2018-09-18 MED ORDER — KETOROLAC TROMETHAMINE 60 MG/2ML IM SOLN
60.0000 mg | Freq: Once | INTRAMUSCULAR | Status: AC
  Administered 2018-09-18: 60 mg via INTRAMUSCULAR
  Filled 2018-09-18: qty 2

## 2018-09-18 NOTE — Discharge Instructions (Signed)
Return to the ER if your knee gets redness, swelling or fever

## 2018-09-18 NOTE — ED Provider Notes (Signed)
[facility]-EMERGENCY DEPT Provider Note   CSN: [id number] Arrival date & time: 09/18/18  2048     History   Chief Complaint Chief Complaint  Patient presents with  . Knee Pain    Right    HPI Marco Torres is a 27 y.o. male.  The history is provided by the patient.  Knee Pain   This is a new problem. The current episode started yesterday. The problem occurs constantly. The problem has been gradually worsening. The pain is present in the right knee. The quality of the pain is described as constant. The pain is at a severity of 9/10. The pain is severe. Associated symptoms include limited range of motion. Associated symptoms comments: Pain with standing or moving the right knee.  No known injury.  No pain behind the knee.  No prior knee surgeries or pain.  No increased exertional activity or prolonged standing.  No fever or knee swelling.  No calf tenderness.  No numbness.  NO penile d/c or dysuria.. The symptoms are aggravated by activity and standing. He has tried OTC ointments for the symptoms. The treatment provided no relief. There has been no history of extremity trauma.    Past Medical History:  Diagnosis Date  . Hypertension     Patient Active Problem List   Diagnosis Date Noted  . Conductive hearing loss of left ear with unrestricted hearing of right ear 09/09/2017  . Ear pain, left 09/09/2017  . Eustachian tube dysfunction, left 09/09/2017  . Hypertensive disorder 11/21/2016    Past Surgical History:  Procedure Laterality Date  . TONSILLECTOMY          Home Medications    Prior to Admission medications   Medication Sig Start Date End Date Taking? Authorizing Provider  amoxicillin (AMOXIL) 500 MG tablet Take 1 tablet (500 mg total) by mouth 2 (two) times daily. 11/10/15   Derwood Kaplan, MD  Cetirizine HCl 10 MG CAPS Take 1 capsule (10 mg total) by mouth daily. 08/25/17   Elpidio Anis, PA-C  fluticasone (FLONASE) 50 MCG/ACT nasal  spray Place 2 sprays into both nostrils daily. 09/04/17   Palumbo, April, MD  lisinopril (PRINIVIL,ZESTRIL) 20 MG tablet Take 20 mg by mouth daily.    [provider]  Multiple Vitamins-Minerals (EMERGEN-C VITAMIN C PO) Take 1 packet by mouth daily.    [provider]  naproxen (NAPROSYN) 250 MG tablet Take 1 tablet (250 mg total) by mouth 2 (two) times daily with a meal. 01/08/18   Everlene Farrier, PA-C  triamcinolone (NASACORT) 55 MCG/ACT AERO nasal inhaler Place 2 sprays into the nose daily. 08/25/17   Elpidio Anis, PA-C    Family History History reviewed. No pertinent family history.  Social History Social History   Tobacco Use  . Smoking status: Never Smoker  . Smokeless tobacco: Never Used  Substance Use Topics  . Alcohol use: Yes  . Drug use: No     Allergies   Patient has no known allergies.   Review of Systems Review of Systems  All other systems reviewed and are negative.    Physical Exam Updated Vital Signs BP (!) 180/94 (BP Location: Left Arm)   Pulse 84   Temp 98.8 F (37.1 C) (Oral)   Resp 16   SpO2 95%   Physical Exam  Constitutional: He appears well-developed and well-nourished. No distress.  HENT:  Head: Normocephalic.  Eyes: Pupils are equal, round, and reactive to light.  Cardiovascular: Normal rate.  Pulmonary/Chest:  Effort normal.  Musculoskeletal: He exhibits tenderness.       Right knee: He exhibits decreased range of motion and bony tenderness. He exhibits no swelling, no ecchymosis, no deformity and no erythema. Tenderness found. Patellar tendon tenderness noted. No medial joint line and no lateral joint line tenderness noted.       Legs: Skin: Skin is warm and dry.  Psychiatric: He has a normal mood and affect. His behavior is normal.  Nursing note and vitals reviewed.    ED Treatments / Results  Labs (all labs ordered are listed, but only abnormal results are displayed) Labs Reviewed - No data to  display  EKG None  Radiology Dg Knee Complete 4 Views Right  Result Date: 09/18/2018 CLINICAL DATA:  Anterior right knee pain without known injury. EXAM: RIGHT KNEE - COMPLETE 4+ VIEW COMPARISON:  None. FINDINGS: Small degenerative spurs are seen in the lateral and patellofemoral compartments. No evidence for fracture or subluxation. No worrisome lytic or sclerotic osseous abnormality. No joint effusion. IMPRESSION: Minimal degenerative spurring.  No acute findings. Electronically Signed   By: Kennith Center M.D.   On: 09/18/2018 21:49    Procedures Procedures (including critical care time)  Medications Ordered in ED Medications  ketorolac (TORADOL) injection 60 mg (has no administration in time range)     Initial Impression / Assessment and Plan / ED Course  I have reviewed the triage vital signs and the nursing notes.  Pertinent labs & imaging results that were available during my care of the patient were reviewed by me and considered in my medical decision making (see chart for details).     Obese 27y/o male presenting with worsening right knee pain.  No known injury.  Point tender over the distal patellar tendon and tibial tuberosity.  NO joint swelling, warmth or erythema.  No calf or thigh tenderness concerning for DVT.  Normal distal pulse and no edema.  Pt does not have signs of septic joint and has no popliteal tenderness concerning for baker's cyst.  No STD type sx concerning for septic arthritis. Pt given toradol and knee film pending.  10:46 PM Patient's knee showed minimal degenerative spurring but no other acute findings.  Again he has no symptoms of septic arthritis.  Symptoms are improved after Toradol.  Will place a knee sleeve and give naproxen.  Patient has a cane at home he can use as he is not interested in using crutches  Final Clinical Impressions(s) / ED Diagnoses   Final diagnoses:  Acute pain of right knee    ED Discharge Orders         Ordered     naproxen (NAPROSYN) 500 MG tablet  2 times daily     09/18/18 2245           Gwyneth Sprout, MD 09/18/18 2246

## 2018-09-18 NOTE — ED Triage Notes (Signed)
Pt is c/o right knee pain of unknown cause, denies any kind trauma, joint or orthopedic problems.

## 2018-09-19 NOTE — ED Notes (Signed)
Ace bandages applied due to knee sleeve being too small

## 2019-04-17 ENCOUNTER — Other Ambulatory Visit: Payer: Self-pay

## 2019-04-17 ENCOUNTER — Emergency Department (HOSPITAL_COMMUNITY): Payer: Self-pay

## 2019-04-17 ENCOUNTER — Encounter (HOSPITAL_COMMUNITY): Payer: Self-pay | Admitting: Emergency Medicine

## 2019-04-17 ENCOUNTER — Emergency Department (HOSPITAL_COMMUNITY)
Admission: EM | Admit: 2019-04-17 | Discharge: 2019-04-17 | Disposition: A | Discharge status: Home / Self Care | Payer: Self-pay | Attending: Emergency Medicine | Admitting: Emergency Medicine

## 2019-04-17 DIAGNOSIS — I10 Essential (primary) hypertension: Secondary | ICD-10-CM | POA: Insufficient documentation

## 2019-04-17 DIAGNOSIS — Z79899 Other long term (current) drug therapy: Secondary | ICD-10-CM | POA: Insufficient documentation

## 2019-04-17 DIAGNOSIS — M79671 Pain in right foot: Secondary | ICD-10-CM | POA: Diagnosis not present

## 2019-04-17 NOTE — ED Notes (Signed)
Patient verbalizes understanding of discharge instructions. Opportunity for questioning and answers were provided. Armband removed by staff, pt discharged from ED ambulatory to home.  

## 2019-04-17 NOTE — Discharge Instructions (Addendum)
Today your x-rays did not show any broken bones.  I would recommend that you follow-up with your podiatrist.  I have also given you the information for the bone doctor or orthopedist who is on-call.  You may schedule an appointment with them.  Develop fevers, worsening symptoms or have additional concerns please seek additional medical care and evaluation.

## 2019-04-17 NOTE — ED Triage Notes (Signed)
Pt in with c/o R lateral foot pain x 5 days. Using cane, able to bear weight on extremity. Denies any injury

## 2019-04-17 NOTE — ED Provider Notes (Signed)
[facility] Note   CSN: [id number] Arrival date & time: 04/17/19  1006    History   Chief Complaint Chief Complaint  Patient presents with  . Foot Pain    HPI Marco Torres is a 28 y.o. male with past medical history of hypertension who presents today for evaluation of right lateral foot pain.  He reports that his pain is been going on for approximately 5 days.  He has been using a cane and taking ibuprofen at home.  He does not normally use a cane to walk.  He is able to bear weight on the right foot however walks on his heel.  He denies any specific injury.  He denies fevers.     HPI  Past Medical History:  Diagnosis Date  . Hypertension     Patient Active Problem List   Diagnosis Date Noted  . Conductive hearing loss of left ear with unrestricted hearing of right ear 09/09/2017  . Ear pain, left 09/09/2017  . Eustachian tube dysfunction, left 09/09/2017  . Hypertensive disorder 11/21/2016    Past Surgical History:  Procedure Laterality Date  . TONSILLECTOMY          Home Medications    Prior to Admission medications   Medication Sig Start Date End Date Taking? Authorizing Provider  amoxicillin (AMOXIL) 500 MG tablet Take 1 tablet (500 mg total) by mouth 2 (two) times daily. 11/10/15   Derwood KaplanNanavati, Ankit, MD  Cetirizine HCl 10 MG CAPS Take 1 capsule (10 mg total) by mouth daily. 08/25/17   Elpidio AnisUpstill, Shari, PA-C  fluticasone (FLONASE) 50 MCG/ACT nasal spray Place 2 sprays into both nostrils daily. 09/04/17   Palumbo, April, MD  lisinopril (PRINIVIL,ZESTRIL) 20 MG tablet Take 20 mg by mouth daily.    [provider]  Multiple Vitamins-Minerals (EMERGEN-C VITAMIN C PO) Take 1 packet by mouth daily.    [provider]  naproxen (NAPROSYN) 500 MG tablet Take 1 tablet (500 mg total) by mouth 2 (two) times daily. 09/18/18   Gwyneth SproutPlunkett, Whitney, MD  triamcinolone (NASACORT) 55 MCG/ACT AERO nasal inhaler Place 2  sprays into the nose daily. 08/25/17   Elpidio AnisUpstill, Shari, PA-C    Family History No family history on file.  Social History Social History   Tobacco Use  . Smoking status: Never Smoker  . Smokeless tobacco: Never Used  Substance Use Topics  . Alcohol use: Yes  . Drug use: No     Allergies   Patient has no known allergies.   Review of Systems Review of Systems  Constitutional: Negative for chills and fever.  Skin: Negative for color change and wound.  All other systems reviewed and are negative.    Physical Exam Updated Vital Signs BP 115/64 (BP Location: Right Arm)   Pulse 97   Temp 98.4 F (36.9 C) (Oral)   Resp 18   Wt (!) 186 kg   SpO2 98%   BMI 51.25 kg/m   Physical Exam Vitals signs and nursing note reviewed.  Constitutional:      Appearance: He is obese. He is not ill-appearing.  HENT:     Head: Normocephalic and atraumatic.  Cardiovascular:     Comments: 2+ DP/PT pulses bilaterally. Musculoskeletal:     Comments: There is tenderness to palpation over the right plantar surface of the lateral foot near the distal fifth metatarsal approximately.   1+ pitting edema bilaterally.  Skin:    Comments: There is no abnormal erythema,  edema, wounds, or discoloration over the right foot.  No significant skin maceration in between toes.  Neurological:     General: No focal deficit present.     Mental Status: He is alert.     Comments: Sensation intact to bilateral lower extremities.  Psychiatric:        Mood and Affect: Mood normal.      ED Treatments / Results  Labs (all labs ordered are listed, but only abnormal results are displayed) Labs Reviewed - No data to display  EKG None  Radiology Dg Foot Complete Right  Result Date: 04/17/2019 CLINICAL DATA:  Lateral right foot pain.  No known injury. EXAM: RIGHT FOOT COMPLETE - 3+ VIEW COMPARISON:  None. FINDINGS: There is no evidence of fracture or dislocation. There is no evidence of arthropathy or other  focal bone abnormality. Soft tissues are unremarkable. IMPRESSION: Negative exam. Electronically Signed   By: Drusilla Kanner M.D.   On: 04/17/2019 12:31    Procedures Procedures (including critical care time)  Medications Ordered in ED Medications - No data to display   Initial Impression / Assessment and Plan / ED Course  I have reviewed the triage vital signs and the nursing notes.  Pertinent labs & imaging results that were available during my care of the patient were reviewed by me and considered in my medical decision making (see chart for details).       Patient presents today for evaluation of right lateral foot pain for approximately 6 days.  He is afebrile here.  He does not have any significant unilateral swelling.  His pain is recreatable with palpation over the general area of the right fifth metatarsal distally.  X-rays were obtained showing no acute abnormalities.  Recommended conservative care, OTC pain medications.  No evidence of infection at this time.  He does not have the abnormal redness or severe pain I would expect with gout.    Return precautions were discussed with patient who states their understanding.  At the time of discharge patient denied any unaddressed complaints or concerns.  Patient is agreeable for discharge home.  Given ortho follow up.   Final Clinical Impressions(s) / ED Diagnoses   Final diagnoses:  Right foot pain    ED Discharge Orders    None       Norman Clay 04/17/19 1312    Margarita Grizzle, MD 04/18/19 1048

## 2020-05-24 DIAGNOSIS — E1169 Type 2 diabetes mellitus with other specified complication: Secondary | ICD-10-CM | POA: Diagnosis not present

## 2020-05-24 DIAGNOSIS — M109 Gout, unspecified: Secondary | ICD-10-CM | POA: Diagnosis not present

## 2020-05-24 DIAGNOSIS — E7800 Pure hypercholesterolemia, unspecified: Secondary | ICD-10-CM | POA: Diagnosis not present

## 2020-05-24 DIAGNOSIS — E119 Type 2 diabetes mellitus without complications: Secondary | ICD-10-CM | POA: Diagnosis not present

## 2020-05-24 DIAGNOSIS — I10 Essential (primary) hypertension: Secondary | ICD-10-CM | POA: Diagnosis not present

## 2020-06-08 ENCOUNTER — Emergency Department (HOSPITAL_BASED_OUTPATIENT_CLINIC_OR_DEPARTMENT_OTHER): Payer: BC Managed Care – PPO

## 2020-06-08 ENCOUNTER — Ambulatory Visit (HOSPITAL_BASED_OUTPATIENT_CLINIC_OR_DEPARTMENT_OTHER)
Admission: RE | Admit: 2020-06-08 | Discharge: 2020-06-08 | Disposition: A | Discharge status: Home / Self Care | Payer: BC Managed Care – PPO | Source: Ambulatory Visit | Attending: Emergency Medicine | Admitting: Emergency Medicine

## 2020-06-08 ENCOUNTER — Other Ambulatory Visit: Payer: Self-pay

## 2020-06-08 ENCOUNTER — Emergency Department (HOSPITAL_BASED_OUTPATIENT_CLINIC_OR_DEPARTMENT_OTHER)
Admission: EM | Admit: 2020-06-08 | Discharge: 2020-06-08 | Disposition: A | Discharge status: Home / Self Care | Payer: BC Managed Care – PPO | Attending: Emergency Medicine | Admitting: Emergency Medicine

## 2020-06-08 ENCOUNTER — Encounter (HOSPITAL_BASED_OUTPATIENT_CLINIC_OR_DEPARTMENT_OTHER): Payer: Self-pay

## 2020-06-08 DIAGNOSIS — E119 Type 2 diabetes mellitus without complications: Secondary | ICD-10-CM | POA: Diagnosis not present

## 2020-06-08 DIAGNOSIS — I10 Essential (primary) hypertension: Secondary | ICD-10-CM | POA: Insufficient documentation

## 2020-06-08 DIAGNOSIS — M25572 Pain in left ankle and joints of left foot: Secondary | ICD-10-CM | POA: Diagnosis not present

## 2020-06-08 DIAGNOSIS — Z79899 Other long term (current) drug therapy: Secondary | ICD-10-CM | POA: Insufficient documentation

## 2020-06-08 DIAGNOSIS — M7662 Achilles tendinitis, left leg: Secondary | ICD-10-CM

## 2020-06-08 DIAGNOSIS — M7989 Other specified soft tissue disorders: Secondary | ICD-10-CM | POA: Diagnosis not present

## 2020-06-08 DIAGNOSIS — M7732 Calcaneal spur, left foot: Secondary | ICD-10-CM | POA: Diagnosis not present

## 2020-06-08 DIAGNOSIS — R600 Localized edema: Secondary | ICD-10-CM | POA: Diagnosis not present

## 2020-06-08 DIAGNOSIS — M79605 Pain in left leg: Secondary | ICD-10-CM

## 2020-06-08 DIAGNOSIS — M25571 Pain in right ankle and joints of right foot: Secondary | ICD-10-CM | POA: Insufficient documentation

## 2020-06-08 HISTORY — DX: Gout, unspecified: M10.9

## 2020-06-08 HISTORY — DX: Type 2 diabetes mellitus without complications: E11.9

## 2020-06-08 MED ORDER — OXYCODONE-ACETAMINOPHEN 5-325 MG PO TABS
1.0000 | ORAL_TABLET | Freq: Once | ORAL | Status: AC
  Administered 2020-06-08: 1 via ORAL
  Filled 2020-06-08: qty 1

## 2020-06-08 MED ORDER — ONDANSETRON 4 MG PO TBDP
4.0000 mg | ORAL_TABLET | Freq: Once | ORAL | Status: AC
  Administered 2020-06-08: 4 mg via ORAL
  Filled 2020-06-08: qty 1

## 2020-06-08 MED ORDER — IBUPROFEN 800 MG PO TABS: 800.0000 mg | ORAL_TABLET | Freq: Three times a day (TID) | ORAL | 0 refills | Status: AC | PRN

## 2020-06-08 NOTE — ED Triage Notes (Signed)
Pt presents with pain to the L ankle with gradual worsening x2 days followed by sudden increase of pain tonight after taking his boot off. Pt has limited movement to the ankle.

## 2020-06-08 NOTE — ED Provider Notes (Signed)
TIME SEEN: 1:20 AM  CHIEF COMPLAINT: Left foot and ankle pain  HPI: Patient is a 29 year old male with history of hypertension, diabetes, gout presents emergency department with left ankle and foot pain that started last night taking off his shoe.  No known injury.  No swelling he is appreciated compared to the right side.  No fever.  No history of PE or DVT.  No redness or warmth. ROS: See HPI Constitutional: no fever  Eyes: no drainage  ENT: no runny nose   Cardiovascular:  no chest pain  Resp: no SOB  GI: no vomiting GU: no dysuria Integumentary: no rash  Allergy: no hives  Musculoskeletal: no leg swelling  Neurological: no slurred speech ROS otherwise negative  PAST MEDICAL HISTORY/PAST SURGICAL HISTORY:  Past Medical History:  Diagnosis Date  . Diabetes mellitus without complication ([name])   . Gout   . Hypertension     MEDICATIONS:  Prior to Admission medications   Medication Sig Start Date End Date Taking? Authorizing Provider  amoxicillin (AMOXIL) 500 MG tablet Take 1 tablet (500 mg total) by mouth 2 (two) times daily. 11/10/15   Varney Biles, MD  Cetirizine HCl 10 MG CAPS Take 1 capsule (10 mg total) by mouth daily. 08/25/17   Charlann Lange, PA-C  fluticasone (FLONASE) 50 MCG/ACT nasal spray Place 2 sprays into both nostrils daily. 09/04/17   Palumbo, April, MD  lisinopril (PRINIVIL,ZESTRIL) 20 MG tablet Take 20 mg by mouth daily.    [provider]  Multiple Vitamins-Minerals (EMERGEN-C VITAMIN C PO) Take 1 packet by mouth daily.    [provider]  naproxen (NAPROSYN) 500 MG tablet Take 1 tablet (500 mg total) by mouth 2 (two) times daily. 09/18/18   Blanchie Dessert, MD  triamcinolone (NASACORT) 55 MCG/ACT AERO nasal inhaler Place 2 sprays into the nose daily. 08/25/17   Charlann Lange, PA-C    ALLERGIES:  No Known Allergies  SOCIAL HISTORY:  Social History   Tobacco Use  . Smoking status: Never Smoker  . Smokeless tobacco: Never Used   Substance Use Topics  . Alcohol use: Not Currently    FAMILY HISTORY: No family history on file.  EXAM: BP (!) 178/104 (BP Location: Right Arm)   Pulse 99   Temp 98.7 F (37.1 C) (Oral)   Resp 20   Ht 6\' 3"  (1.905 m)   Wt (!) 188.2 kg   SpO2 97%   BMI 51.87 kg/m  CONSTITUTIONAL: Alert and responds appropriately to questions. Well-appearing; well-nourished HEAD: Normocephalic, atraumatic EYES: Conjunctivae clear, pupils appear equal ENT: normal nose; moist mucous membranes NECK: Normal range of motion CARD: Regular rate and rhythm RESP: Normal chest excursion without splinting or tachypnea; no hypoxia or respiratory distress, speaking full sentences ABD/GI: non-distended EXT: Normal ROM in all joints, no major deformities noted, patient is tender to palpation over the left heel, left ankle and left Achilles tendon.  He has normal dorsi and plantar flexion of this extremity.  He does have some lower left calf tenderness but no appreciable swelling.  No redness, warmth, induration or fluctuance.  He has a 2+ left DP pulse.  Compartments in the left lower extremity are soft.  No joint effusion noted.  SKIN: Normal color for age and race, no rashes on exposed skin NEURO: Moves all extremities equally, normal speech, no facial asymmetry noted PSYCH: The patient's mood and manner are appropriate. Grooming and personal hygiene are appropriate.  MEDICAL DECISION MAKING: Patient here with left foot and ankle pain.  I suspect that he has Achilles tendinitis.  No sign of complete Achilles rupture.  He denies any injury.  X-rays obtained showed no bony abnormality.  There is no sign of cellulitis, abscess, gout on exam currently.  Neurovascular intact distally.  No compartment syndrome.  We will have him come back in the morning for venous Doppler to rule out DVT.  Patient comfortable with this plan.  Provided with Ace wrap for comfort, crutches for comfort.  Recommended ice, elevation, NSAIDs.   Provided with work note.  At this time, I do not feel there is any life-threatening condition present. I have reviewed, interpreted and discussed all results (EKG, imaging, lab, urine as appropriate) and exam findings with patient/family. I have reviewed nursing notes and appropriate previous records.  I feel the patient is safe to be discharged home without further emergent workup and can continue workup as an outpatient as needed. Discussed usual and customary return precautions. Patient/family verbalize understanding and are comfortable with this plan.  Outpatient follow-up has been provided as needed. All questions have been answered.   Marco Torres was evaluated in Emergency Department on 06/08/2020 for the symptoms described in the history of present illness. He was evaluated in the context of the global COVID-19 pandemic, which necessitated consideration that the patient might be at risk for infection with the SARS-CoV-2 virus that causes COVID-19. Institutional protocols and algorithms that pertain to the evaluation of patients at risk for COVID-19 are in a state of rapid change based on information released by regulatory bodies including the CDC and federal and state organizations. These policies and algorithms were followed during the patient's care in the ED.      Earsie Humm, Layla Maw, DO 06/08/20 [phone]

## 2020-06-08 NOTE — ED Provider Notes (Signed)
DVT US ordered last night negative. Discussed findings with pt.    Alveria Apley, PA-C 06/08/20 1231    Virgina Norfolk, DO 06/08/20 1423

## 2020-06-08 NOTE — Discharge Instructions (Addendum)
You may alternate Tylenol 1000 mg every 6 hours as needed for pain and Ibuprofen 800 mg every 8 hours as needed for pain.  Please take Ibuprofen with food.  Do not take more than 4000 mg of Tylenol (acetaminophen) in a 24 hour period.  

## 2020-06-10 DIAGNOSIS — M7662 Achilles tendinitis, left leg: Secondary | ICD-10-CM | POA: Diagnosis not present

## 2020-06-24 DIAGNOSIS — M67969 Unspecified disorder of synovium and tendon, unspecified lower leg: Secondary | ICD-10-CM | POA: Diagnosis not present

## 2020-07-02 DIAGNOSIS — M7662 Achilles tendinitis, left leg: Secondary | ICD-10-CM | POA: Diagnosis not present

## 2020-07-05 DIAGNOSIS — M7662 Achilles tendinitis, left leg: Secondary | ICD-10-CM | POA: Diagnosis not present

## 2020-07-09 DIAGNOSIS — M7662 Achilles tendinitis, left leg: Secondary | ICD-10-CM | POA: Diagnosis not present

## 2020-07-11 DIAGNOSIS — M7662 Achilles tendinitis, left leg: Secondary | ICD-10-CM | POA: Diagnosis not present

## 2020-07-15 DIAGNOSIS — M67969 Unspecified disorder of synovium and tendon, unspecified lower leg: Secondary | ICD-10-CM | POA: Diagnosis not present

## 2020-07-17 DIAGNOSIS — M7662 Achilles tendinitis, left leg: Secondary | ICD-10-CM | POA: Diagnosis not present

## 2020-07-23 DIAGNOSIS — M7662 Achilles tendinitis, left leg: Secondary | ICD-10-CM | POA: Diagnosis not present

## 2020-07-29 DIAGNOSIS — M7662 Achilles tendinitis, left leg: Secondary | ICD-10-CM | POA: Diagnosis not present

## 2020-07-31 DIAGNOSIS — M7662 Achilles tendinitis, left leg: Secondary | ICD-10-CM | POA: Diagnosis not present

## 2020-08-06 DIAGNOSIS — M7662 Achilles tendinitis, left leg: Secondary | ICD-10-CM | POA: Diagnosis not present

## 2020-08-13 DIAGNOSIS — M7662 Achilles tendinitis, left leg: Secondary | ICD-10-CM | POA: Diagnosis not present

## 2020-08-14 DIAGNOSIS — M67962 Unspecified disorder of synovium and tendon, left lower leg: Secondary | ICD-10-CM | POA: Diagnosis not present

## 2020-08-20 DIAGNOSIS — M7662 Achilles tendinitis, left leg: Secondary | ICD-10-CM | POA: Diagnosis not present

## 2020-08-22 DIAGNOSIS — M7662 Achilles tendinitis, left leg: Secondary | ICD-10-CM | POA: Diagnosis not present

## 2020-08-29 DIAGNOSIS — Z20822 Contact with and (suspected) exposure to covid-19: Secondary | ICD-10-CM | POA: Diagnosis not present

## 2020-09-06 ENCOUNTER — Other Ambulatory Visit: Payer: Self-pay | Admitting: Oncology

## 2020-09-06 DIAGNOSIS — U071 COVID-19: Secondary | ICD-10-CM

## 2020-09-06 NOTE — Progress Notes (Signed)
I connected by phone with Mr. Marco Torres discuss the potential use of an new treatment for mild to moderate COVID-19 viral infection in non-hospitalized patients.  This patient is a age/sex that meets the FDA criteria for Emergency Use Authorization of casirivimab\imdevimab.  Has a (+) direct SARS-CoV-2 viral test result 1. Has mild or moderate COVID-19  2. Is ? 30 years of age and weighs ? 40 kg 3. Is NOT hospitalized due to COVID-19 4. Is NOT requiring oxygen therapy or requiring an increase in baseline oxygen flow rate due to COVID-19 5. Is within 10 days of symptom onset 6. Has at least one of the high risk factor(s) for progression to severe COVID-19 and/or hospitalization as defined in EUA. Specific high risk criteria : Past Medical History:  Diagnosis Date  . Diabetes mellitus without complication ([facility])   . Gout   . Hypertension   ?  ?    Symptom onset  09/01/2020   I have spoken and communicated the following to the patient or parent/caregiver:   1. FDA has authorized the emergency use of casirivimab\imdevimab for the treatment of mild to moderate COVID-19 in adults and pediatric patients with positive results of direct SARS-CoV-2 viral testing who are 13 years of age and older weighing at least 40 kg, and who are at high risk for progressing to severe COVID-19 and/or hospitalization.   2. The significant known and potential risks and benefits of casirivimab\imdevimab, and the extent to which such potential risks and benefits are unknown.   3. Information on available alternative treatments and the risks and benefits of those alternatives, including clinical trials.   4. Patients treated with casirivimab\imdevimab should continue to self-isolate and use infection control measures (e.g., wear mask, isolate, social distance, avoid sharing personal items, clean and disinfect "high touch" surfaces, and frequent handwashing) according to CDC guidelines.    5. The patient or  parent/caregiver has the option to accept or refuse casirivimab\imdevimab .   After reviewing this information with the patient, The patient agreed to proceed with receiving casirivimab\imdevimab infusion and will be provided a copy of the Fact sheet prior to receiving the infusion.Mignon Pine, AGNP-C [phone] (Infusion Center Hotline)

## 2020-09-07 ENCOUNTER — Ambulatory Visit (HOSPITAL_COMMUNITY)
Admission: RE | Admit: 2020-09-07 | Discharge: 2020-09-07 | Disposition: A | Discharge status: Home / Self Care | Payer: BC Managed Care – PPO | Source: Ambulatory Visit | Attending: Pulmonary Disease | Admitting: Pulmonary Disease

## 2020-09-07 DIAGNOSIS — U071 COVID-19: Secondary | ICD-10-CM | POA: Diagnosis not present

## 2020-09-07 MED ORDER — DIPHENHYDRAMINE HCL 50 MG/ML IJ SOLN: 50.0000 mg | Freq: Once | INTRAMUSCULAR | Status: DC | PRN

## 2020-09-07 MED ORDER — METHYLPREDNISOLONE SODIUM SUCC 125 MG IJ SOLR: 125.0000 mg | Freq: Once | INTRAMUSCULAR | Status: DC | PRN

## 2020-09-07 MED ORDER — SODIUM CHLORIDE 0.9 % IV SOLN
1200.0000 mg | Freq: Once | INTRAVENOUS | Status: AC
  Administered 2020-09-07: 1200 mg via INTRAVENOUS
  Filled 2020-09-07: qty 10

## 2020-09-07 MED ORDER — ALBUTEROL SULFATE HFA 108 (90 BASE) MCG/ACT IN AERS: 2.0000 | INHALATION_SPRAY | Freq: Once | RESPIRATORY_TRACT | Status: DC | PRN

## 2020-09-07 MED ORDER — FAMOTIDINE IN NACL 20-0.9 MG/50ML-% IV SOLN: 20.0000 mg | Freq: Once | INTRAVENOUS | Status: DC | PRN

## 2020-09-07 MED ORDER — EPINEPHRINE 0.3 MG/0.3ML IJ SOAJ: 0.3000 mg | Freq: Once | INTRAMUSCULAR | Status: DC | PRN

## 2020-09-07 MED ORDER — SODIUM CHLORIDE 0.9 % IV SOLN: INTRAVENOUS | Status: DC | PRN

## 2020-09-07 NOTE — Progress Notes (Addendum)
  Diagnosis: COVID-19  Physician: Dr. Wright  Procedure: [facility] Med: casirivimab\imdevimab infusion - Provided patient with casirivimab\imdevimab fact sheet for patients, parents and caregivers prior to infusion.  Complications: No immediate complications noted.  Discharge: Discharged home   Axell Trigueros E Katerina Zurn 09/07/2020   

## 2020-09-07 NOTE — Discharge Instructions (Signed)

## 2020-10-10 DIAGNOSIS — Z23 Encounter for immunization: Secondary | ICD-10-CM | POA: Diagnosis not present

## 2020-10-21 DIAGNOSIS — M25572 Pain in left ankle and joints of left foot: Secondary | ICD-10-CM | POA: Diagnosis not present

## 2020-10-25 DIAGNOSIS — I10 Essential (primary) hypertension: Secondary | ICD-10-CM | POA: Diagnosis not present

## 2020-10-25 DIAGNOSIS — E1169 Type 2 diabetes mellitus with other specified complication: Secondary | ICD-10-CM | POA: Diagnosis not present

## 2020-10-25 DIAGNOSIS — E7800 Pure hypercholesterolemia, unspecified: Secondary | ICD-10-CM | POA: Diagnosis not present

## 2020-10-28 DIAGNOSIS — M7662 Achilles tendinitis, left leg: Secondary | ICD-10-CM | POA: Diagnosis not present

## 2020-10-28 DIAGNOSIS — M79672 Pain in left foot: Secondary | ICD-10-CM | POA: Diagnosis not present

## 2021-10-03 DIAGNOSIS — M79671 Pain in right foot: Secondary | ICD-10-CM | POA: Diagnosis not present

## 2021-10-08 DIAGNOSIS — M79671 Pain in right foot: Secondary | ICD-10-CM | POA: Diagnosis not present

## 2021-10-22 DIAGNOSIS — M79671 Pain in right foot: Secondary | ICD-10-CM | POA: Diagnosis not present

## 2021-10-27 DIAGNOSIS — Z20822 Contact with and (suspected) exposure to covid-19: Secondary | ICD-10-CM | POA: Diagnosis not present

## 2021-11-21 DIAGNOSIS — G4733 Obstructive sleep apnea (adult) (pediatric): Secondary | ICD-10-CM | POA: Diagnosis not present

## 2021-11-27 DIAGNOSIS — E7800 Pure hypercholesterolemia, unspecified: Secondary | ICD-10-CM | POA: Diagnosis not present

## 2021-11-27 DIAGNOSIS — G4733 Obstructive sleep apnea (adult) (pediatric): Secondary | ICD-10-CM | POA: Diagnosis not present

## 2021-11-27 DIAGNOSIS — E1169 Type 2 diabetes mellitus with other specified complication: Secondary | ICD-10-CM | POA: Diagnosis not present

## 2021-11-27 DIAGNOSIS — I10 Essential (primary) hypertension: Secondary | ICD-10-CM | POA: Diagnosis not present

## 2021-12-11 DIAGNOSIS — U071 COVID-19: Secondary | ICD-10-CM | POA: Diagnosis not present

## 2021-12-11 DIAGNOSIS — Z03818 Encounter for observation for suspected exposure to other biological agents ruled out: Secondary | ICD-10-CM | POA: Diagnosis not present

## 2021-12-19 DIAGNOSIS — U071 COVID-19: Secondary | ICD-10-CM | POA: Diagnosis not present

## 2021-12-19 DIAGNOSIS — Z03818 Encounter for observation for suspected exposure to other biological agents ruled out: Secondary | ICD-10-CM | POA: Diagnosis not present

## 2021-12-19 DIAGNOSIS — Z20828 Contact with and (suspected) exposure to other viral communicable diseases: Secondary | ICD-10-CM | POA: Diagnosis not present

## 2021-12-22 DIAGNOSIS — G4733 Obstructive sleep apnea (adult) (pediatric): Secondary | ICD-10-CM | POA: Diagnosis not present

## 2022-01-22 DIAGNOSIS — G4733 Obstructive sleep apnea (adult) (pediatric): Secondary | ICD-10-CM | POA: Diagnosis not present

## 2022-02-12 DIAGNOSIS — G4733 Obstructive sleep apnea (adult) (pediatric): Secondary | ICD-10-CM | POA: Diagnosis not present

## 2022-02-12 DIAGNOSIS — I10 Essential (primary) hypertension: Secondary | ICD-10-CM | POA: Diagnosis not present

## 2022-02-19 DIAGNOSIS — G4733 Obstructive sleep apnea (adult) (pediatric): Secondary | ICD-10-CM | POA: Diagnosis not present

## 2022-03-22 DIAGNOSIS — G4733 Obstructive sleep apnea (adult) (pediatric): Secondary | ICD-10-CM | POA: Diagnosis not present

## 2022-04-12 ENCOUNTER — Emergency Department (HOSPITAL_BASED_OUTPATIENT_CLINIC_OR_DEPARTMENT_OTHER): Payer: BC Managed Care – PPO

## 2022-04-12 ENCOUNTER — Encounter (HOSPITAL_BASED_OUTPATIENT_CLINIC_OR_DEPARTMENT_OTHER): Payer: Self-pay | Admitting: Emergency Medicine

## 2022-04-12 ENCOUNTER — Other Ambulatory Visit: Payer: Self-pay

## 2022-04-12 ENCOUNTER — Emergency Department (HOSPITAL_BASED_OUTPATIENT_CLINIC_OR_DEPARTMENT_OTHER)
Admission: EM | Admit: 2022-04-12 | Discharge: 2022-04-12 | Disposition: A | Discharge status: Home / Self Care | Payer: BC Managed Care – PPO | Attending: Emergency Medicine | Admitting: Emergency Medicine

## 2022-04-12 DIAGNOSIS — M10062 Idiopathic gout, left knee: Secondary | ICD-10-CM | POA: Diagnosis not present

## 2022-04-12 DIAGNOSIS — Z79899 Other long term (current) drug therapy: Secondary | ICD-10-CM | POA: Insufficient documentation

## 2022-04-12 DIAGNOSIS — M109 Gout, unspecified: Secondary | ICD-10-CM | POA: Insufficient documentation

## 2022-04-12 DIAGNOSIS — I10 Essential (primary) hypertension: Secondary | ICD-10-CM | POA: Diagnosis not present

## 2022-04-12 DIAGNOSIS — M25562 Pain in left knee: Secondary | ICD-10-CM | POA: Diagnosis not present

## 2022-04-12 DIAGNOSIS — E119 Type 2 diabetes mellitus without complications: Secondary | ICD-10-CM | POA: Diagnosis not present

## 2022-04-12 MED ORDER — NAPROXEN 250 MG PO TABS
500.0000 mg | ORAL_TABLET | Freq: Once | ORAL | Status: AC
  Administered 2022-04-12: 500 mg via ORAL
  Filled 2022-04-12: qty 2

## 2022-04-12 MED ORDER — NAPROXEN 500 MG PO TABS: 500.0000 mg | ORAL_TABLET | Freq: Two times a day (BID) | ORAL | 0 refills | Status: AC

## 2022-04-12 NOTE — ED Notes (Signed)
Dc instructions and scripts reviewed withpt. No questions or concerns at this time. Pt declined wheelchair and walked out with significant other.  ?

## 2022-04-12 NOTE — ED Triage Notes (Signed)
Left knee pain , no injury or fall , sts woke this Am to a dull , burning pain .  ?

## 2022-04-12 NOTE — ED Provider Notes (Signed)
?[facility] EMERGENCY DEPT ?Provider Note ? ? ?CSN: [id number] ?Arrival date & time: 04/12/22  1710 ? ?  ? ?History ? ?Chief Complaint  ?Patient presents with  ? Knee Pain  ?  left  ? ? ?Marco Torres is a 31 y.o. male with chief complaint of left knee pain starting this morning when he woke up.  No recent injury or fall.  Notes increased pain with range of motion, especially flexion of the knee joint.  Denies fever, chills, malaise, recent skin infection, or trauma to the area.  Hx of gout and diabetes mellitus type 2 and HTN.  Admits to consuming alcohol last night before going to bed, and that this is why he did not take his blood pressure medications this morning.  Has had gout in his great toe, and possibly one of his knees in the past, but has not had any gout flareups since he was switched off of his prior blood pressure medication.  Denies numbness or tingling of the lower extremities.  Denies shortness of breath or chest pain. ? ?The history is provided by the patient and medical records.  ?Knee Pain ? ?  ? ?Home Medications ?Prior to Admission medications   ?Medication Sig Start Date End Date Taking? Authorizing Provider  ?naproxen (NAPROSYN) 500 MG tablet Take 1 tablet (500 mg total) by mouth 2 (two) times daily for 7 days. 04/12/22 04/19/22 Yes Cecil Cobbsockerham, Jolyssa Oplinger M, PA-C  ?amLODipine (NORVASC) 10 MG tablet Take 10 mg by mouth daily. 05/09/20   [provider]  ?amoxicillin (AMOXIL) 500 MG tablet Take 1 tablet (500 mg total) by mouth 2 (two) times daily. 11/10/15   Derwood KaplanNanavati, Ankit, MD  ?Cetirizine HCl 10 MG CAPS Take 1 capsule (10 mg total) by mouth daily. 08/25/17   Elpidio AnisUpstill, Shari, PA-C  ?colchicine 0.6 MG tablet Take 0.6 mg by mouth daily. 05/20/20   [provider]  ?fluticasone (FLONASE) 50 MCG/ACT nasal spray Place 2 sprays into both nostrils daily. 09/04/17   Palumbo, April, MD  ?ibuprofen (ADVIL) 800 MG tablet Take 1 tablet (800 mg total) by mouth every 8 (eight) hours as  needed for mild pain. 06/08/20   Ward, Layla MawKristen N, DO  ?lisinopril (PRINIVIL,ZESTRIL) 20 MG tablet Take 20 mg by mouth daily.    [provider]  ?losartan (COZAAR) 100 MG tablet Take 100 mg by mouth daily. 05/09/20   [provider]  ?Multiple Vitamins-Minerals (EMERGEN-C VITAMIN C PO) Take 1 packet by mouth daily.    [provider]  ?triamcinolone (NASACORT) 55 MCG/ACT AERO nasal inhaler Place 2 sprays into the nose daily. 08/25/17   Elpidio AnisUpstill, Shari, PA-C  ?   ? ?Allergies    ?Patient has no known allergies.   ? ?Review of Systems   ?Review of Systems  ?Musculoskeletal:   ?     Left knee pain  ? ?Physical Exam ?Updated Vital Signs ?BP 127/71   Pulse 90   Temp 98.9 ?F (37.2 ?C)   Resp 20   Ht 6\' 3"  (1.905 m)   Wt (!) 199.6 kg   SpO2 95%   BMI 55.00 kg/m?  ?Physical Exam ?Vitals and nursing note reviewed.  ?Constitutional:   ?   General: He is not in acute distress. ?   Appearance: He is well-developed. He is obese. He is not ill-appearing or diaphoretic.  ?HENT:  ?   Head: Normocephalic and atraumatic.  ?Eyes:  ?   Conjunctiva/sclera: Conjunctivae normal.  ?Cardiovascular:  ?  Rate and Rhythm: Normal rate and regular rhythm.  ?   Pulses:     ?     Radial pulses are 1+ on the right side and 1+ on the left side.  ?     Dorsalis pedis pulses are 1+ on the right side and 1+ on the left side.  ?   Heart sounds: No murmur heard. ?   Comments: Pulses were more difficult to detect due to excess adipose tissue. ?Pulmonary:  ?   Effort: Pulmonary effort is normal. No respiratory distress.  ?   Breath sounds: Normal breath sounds.  ?Abdominal:  ?   Palpations: Abdomen is soft.  ?   Tenderness: There is no abdominal tenderness.  ?Musculoskeletal:     ?   General: No swelling.  ?   Cervical back: Neck supple.  ?   Right lower leg: Normal. No edema.  ?   Left lower leg: Tenderness present. No deformity, lacerations or bony tenderness. No edema.  ?     Legs: ? ?   Comments: Tenderness of the left  knee as depicted above.  Pain elicited with range of motion such as with flexion of the left knee.  Obvious swelling or erythema not visualized on exam.  1+ DP pulses bilaterally.  Lower extremities appear neurovascularly intact bilaterally.  No tenderness, swelling, erythema, warmth of any other joints.  ?Skin: ?   General: Skin is warm and dry.  ?   Capillary Refill: Capillary refill takes less than 2 seconds.  ?Neurological:  ?   Mental Status: He is alert and oriented to person, place, and time.  ?Psychiatric:     ?   Mood and Affect: Mood normal.  ? ? ?ED Results / Procedures / Treatments   ?Labs ?(all labs ordered are listed, but only abnormal results are displayed) ?Labs Reviewed - No data to display ? ?EKG ?None ? ?Radiology ?DG Knee Complete 4 Views Left ? ?Result Date: 04/12/2022 ?CLINICAL DATA:  Left knee pain. EXAM: LEFT KNEE - COMPLETE 4+ VIEW COMPARISON:  None. FINDINGS: No evidence of fracture, dislocation, or joint effusion. Mild degenerative patellofemoral spurring. Minimal spurring of the tibial spines. No erosion or bone destruction. Small quadriceps and patellar tendon enthesophytes. Soft tissues are unremarkable. IMPRESSION: Mild degenerative patellofemoral spurring. Electronically Signed   By: Narda Rutherford M.D.   On: 04/12/2022 18:36   ? ?Procedures ?Procedures  ? ? ?Medications Ordered in ED ?Medications  ?naproxen (NAPROSYN) tablet 500 mg (500 mg Oral Given 04/12/22 2020)  ? ? ?ED Course/ Medical Decision Making/ A&P ?  ?                        ?Medical Decision Making ?Amount and/or Complexity of Data Reviewed ?External Data Reviewed: notes. ?Labs:  Decision-making details documented in ED Course. ?Radiology: ordered and independent interpretation performed. Decision-making details documented in ED Course. ?ECG/medicine tests:  Decision-making details documented in ED Course. ? ?Risk ?OTC drugs. ?Prescription drug management. ? ? ?31 y.o. male presents to the ED for concern of Knee Pain  (left) ? Marland Kitchen  This involves an extensive number of treatment options, and is a complaint that carries with it a high risk of complications and morbidity.  The emergent differential diagnosis prior to evaluation includes, but is not limited to: Gout, septic arthritis, rheumatoid arthritis, osteoarthritis, osteomyelitis, avascular necrosis ? ?This is not an exhaustive differential.  ? ?Past Medical History / Co-morbidities / Social History: ?Prior gout,  type 2 diabetes mellitus, hypertension ? ?Additional History:  ?Internal and external records from outside source obtained and reviewed including primary care notes ? ?Physical Exam: ?Physical exam performed. The pertinent findings include: Very mild tenderness with flexion and range of motion of the left knee.  No significant erythema or warmth.  Afebrile and not tachycardic. ? ?Lab Tests: ?I considered ordering labs, but patient appears clinically stable and not suspicious of septic arthritis or rheumatoid arthritis. ? ?Imaging Studies: ?I ordered imaging studies including x-ray of left knee.  I independently visualized and interpreted said imaging.  Pertinent results include: ?Without evidence of acute bony pathology ?I agree with the radiologist interpretation. ? ?Medications: ?I ordered medication including naproxen for pain and inflammatory relief.  Reevaluation of the patient after these medicines showed that the patient tolerated this well and noted mild improvement.  I have reviewed the patients home medicines and have made adjustments as needed ? ?ED Course/Disposition: ?Pt well-appearing on exam.  Swelling and tenderness of the left knee joint only.  Painful range of motion of the left knee, especially flexion.  Not suspicious of cellulitis or DVT.  Not suspicious of taumatic etiology.  Not suspicious of tumor, osteomyelitis, ankylosing spondylitis, avascular necrosis.  Patient afebrile, without recent infection, and not tachycardic.  Patient is stable.   Patient recently had alcohol the night before, and woke up with his knee symptoms.  History of gout in the left knee and left great toe.  Patient states and believes this is similar to previous gout episodes.  Consider

## 2022-04-12 NOTE — Discharge Instructions (Addendum)
A prescription has been sent to your pharmacy by the name of naproxen.  You may take 1 tablet twice a day for 7 days.  Always take with plenty of water and food.   ? ?Follow-up with your PCP within the next 2 to 3 days for continued medical management and reevaluation ? ?Return to the ED for new or worsening symptoms as discussed ?

## 2022-04-21 DIAGNOSIS — G4733 Obstructive sleep apnea (adult) (pediatric): Secondary | ICD-10-CM | POA: Diagnosis not present

## 2022-05-22 DIAGNOSIS — G4733 Obstructive sleep apnea (adult) (pediatric): Secondary | ICD-10-CM | POA: Diagnosis not present

## 2022-06-11 IMAGING — DX DG KNEE COMPLETE 4+V*L*
5 series · 5 of 5 positions shown · non-contrast
Comparison: None.

CLINICAL DATA: Left knee pain.

EXAM:
LEFT KNEE - COMPLETE 4+ VIEW

[knee ap]
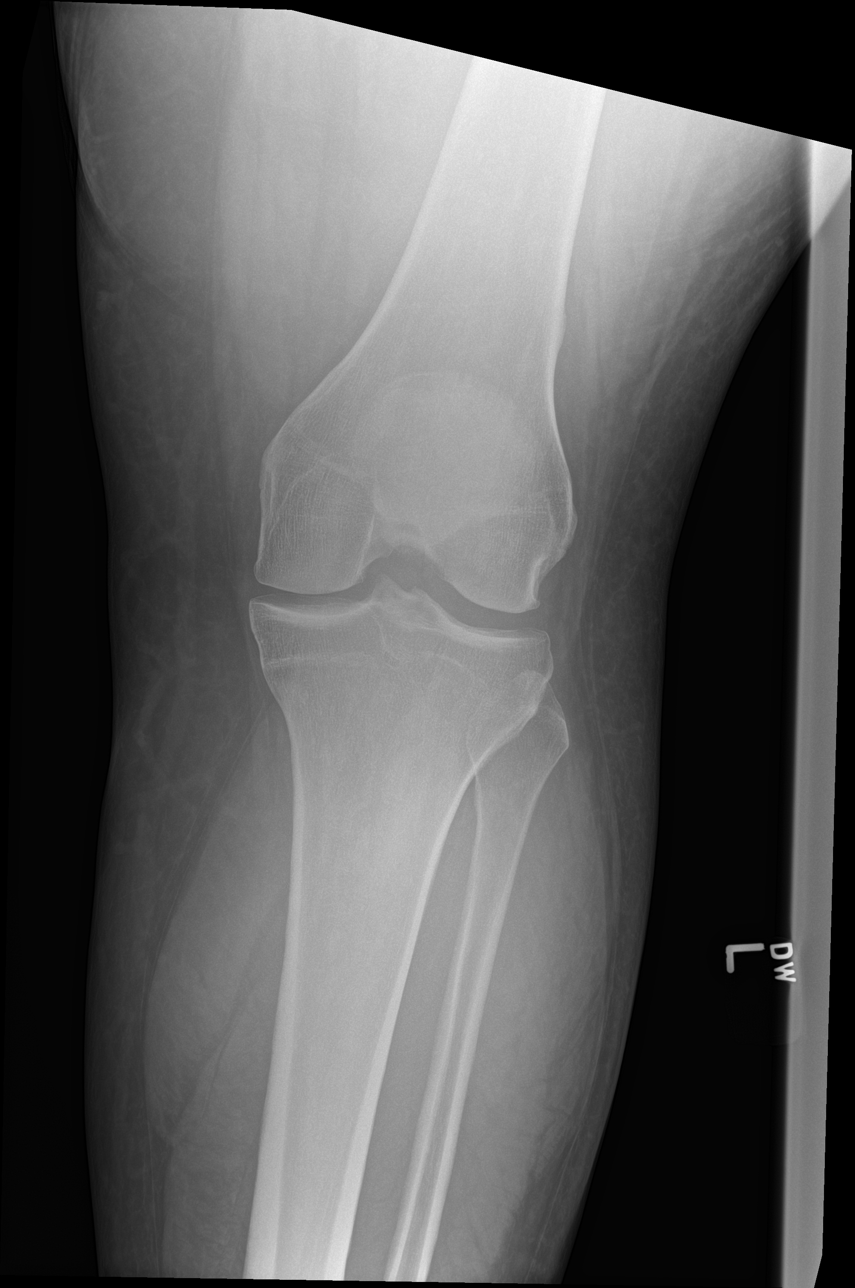

[knee lat]
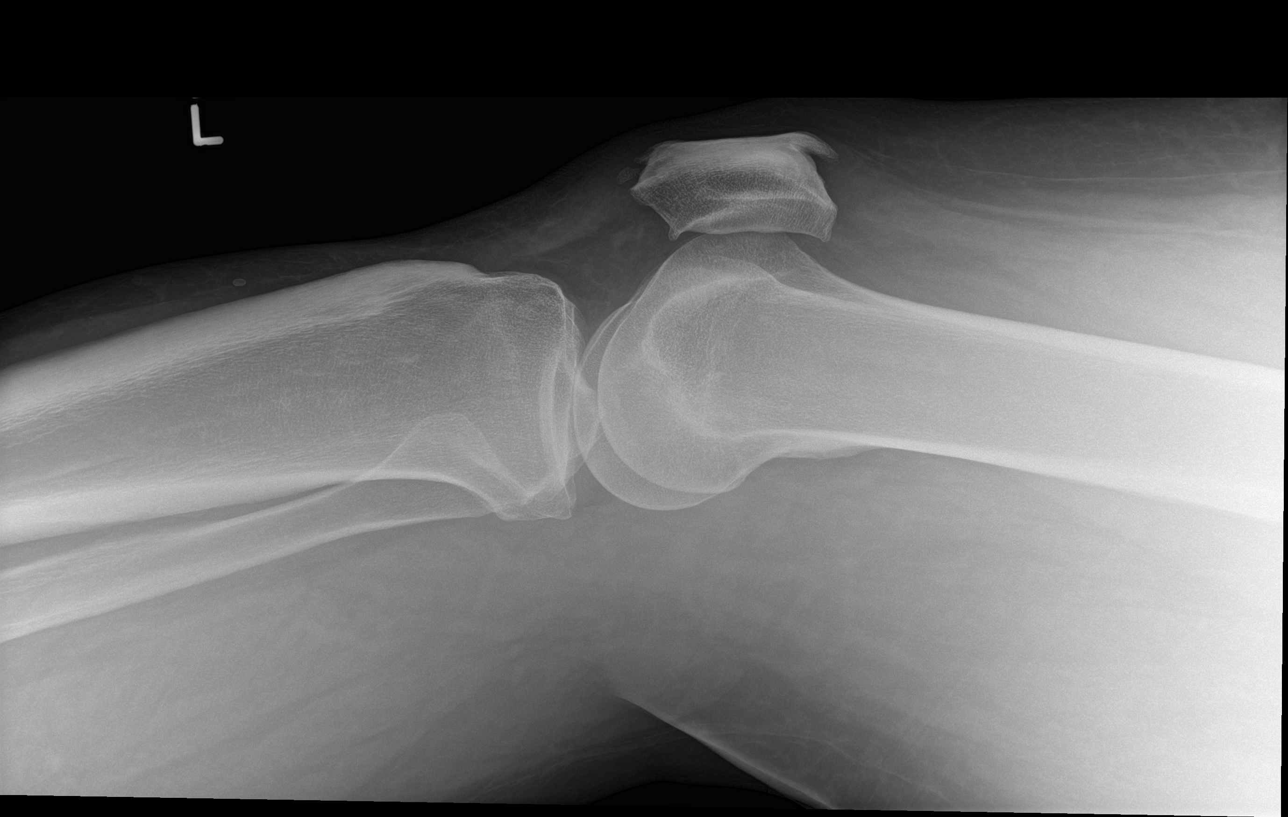

[knee obl (1 of 3)]
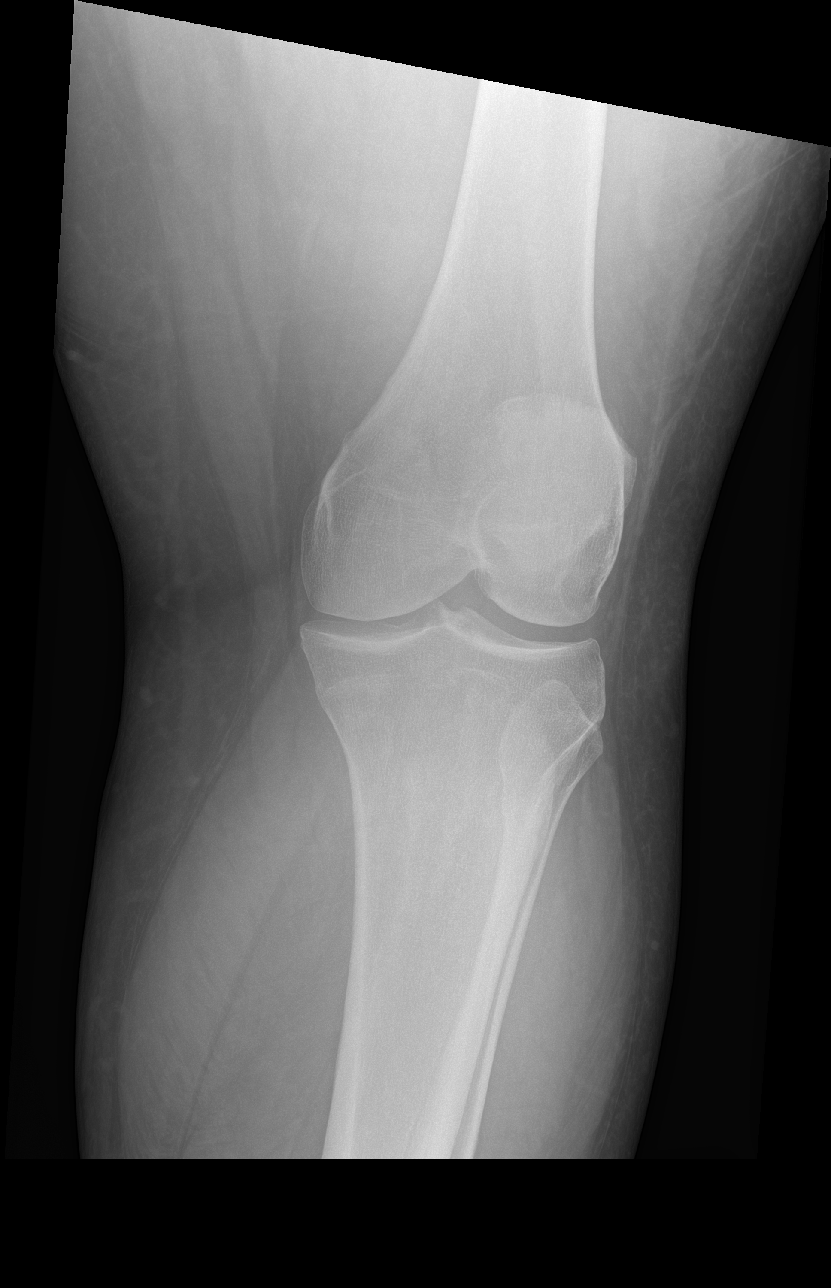

[knee obl (2 of 3)]
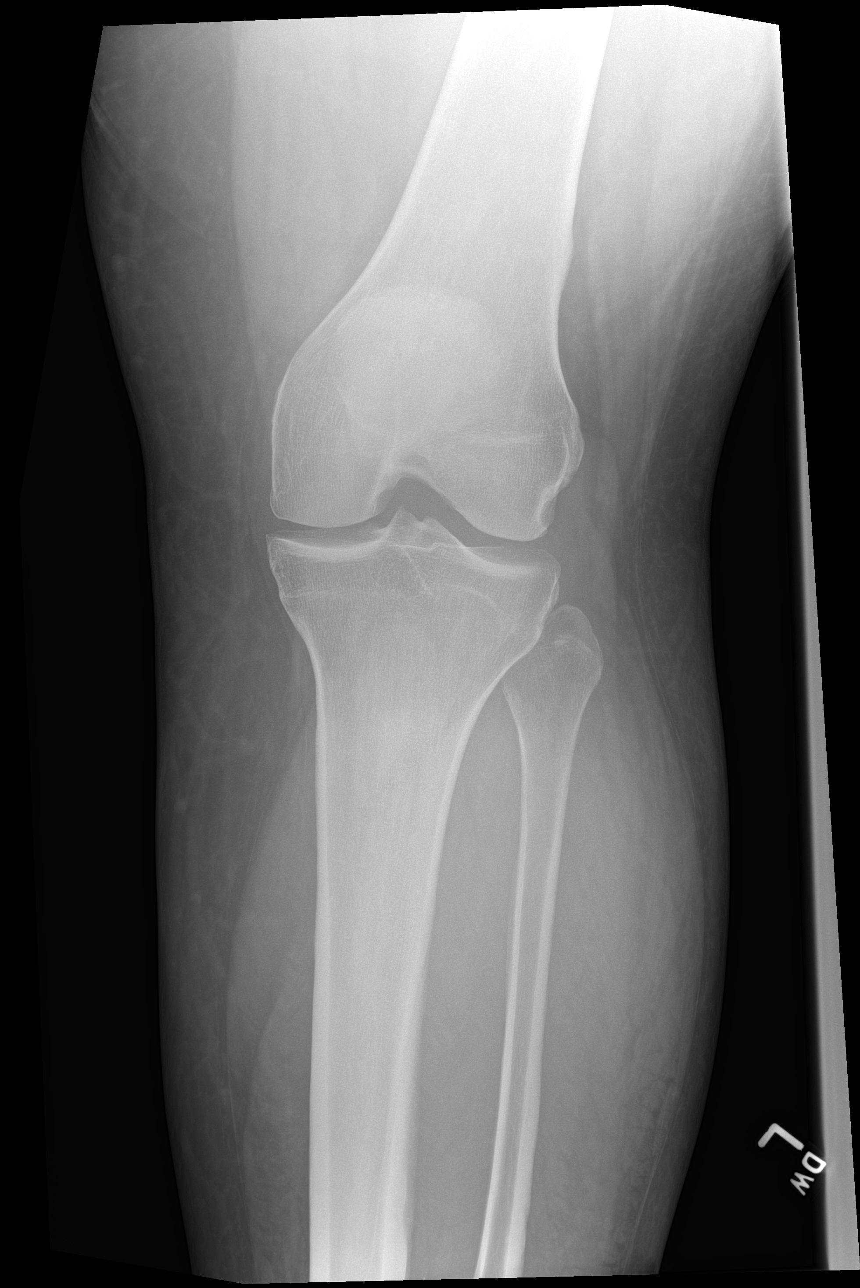

[knee obl (3 of 3)]
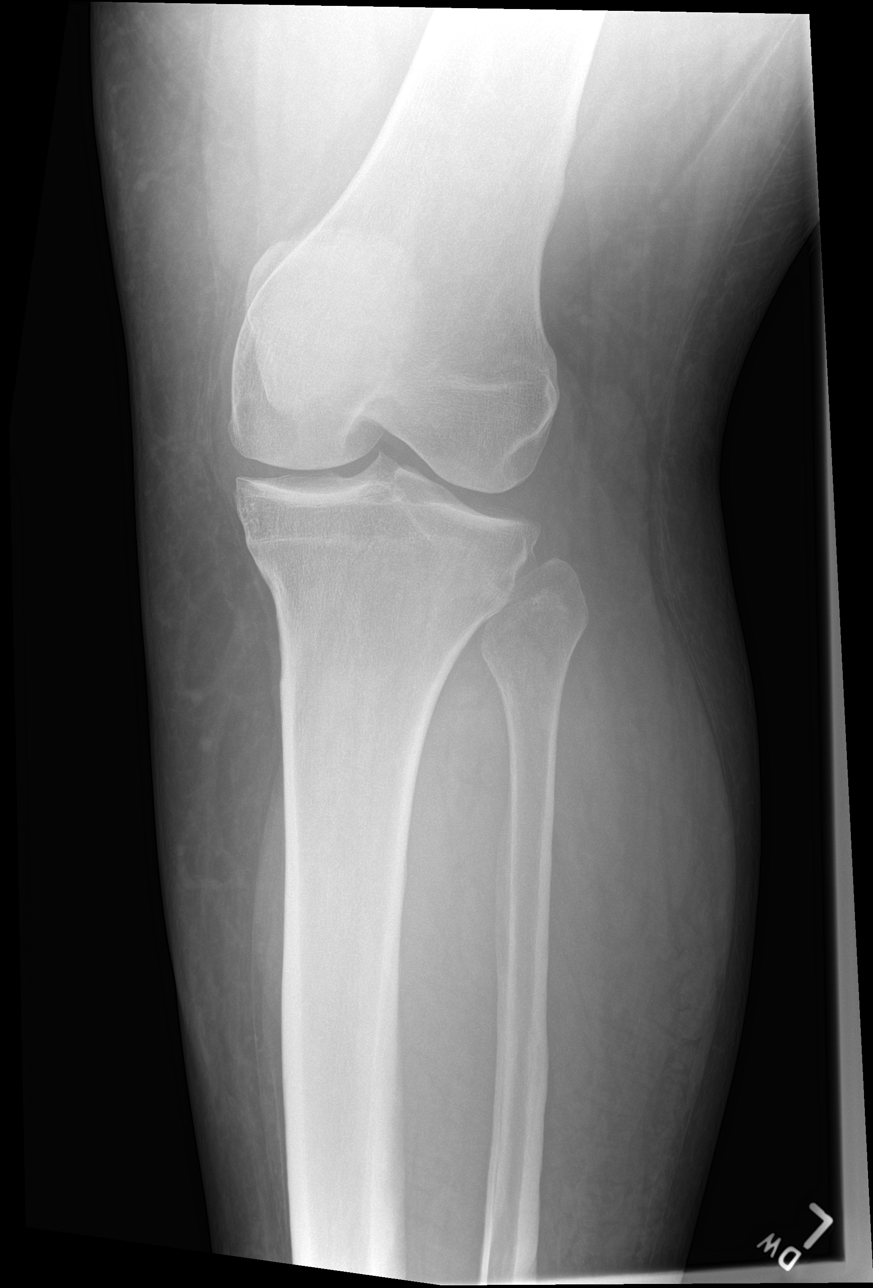

[5 of 5 positions shown; findings below may reference images not displayed]

FINDINGS: No evidence of fracture, dislocation, or joint effusion. Mild
degenerative patellofemoral spurring. Minimal spurring of the tibial
spines. No erosion or bone destruction. Small quadriceps and
patellar tendon enthesophytes. Soft tissues are unremarkable.
IMPRESSION: Mild degenerative patellofemoral spurring.

## 2022-06-21 DIAGNOSIS — G4733 Obstructive sleep apnea (adult) (pediatric): Secondary | ICD-10-CM | POA: Diagnosis not present

## 2022-06-22 DIAGNOSIS — G4733 Obstructive sleep apnea (adult) (pediatric): Secondary | ICD-10-CM | POA: Diagnosis not present

## 2022-06-22 DIAGNOSIS — I10 Essential (primary) hypertension: Secondary | ICD-10-CM | POA: Diagnosis not present

## 2022-07-22 DIAGNOSIS — G4733 Obstructive sleep apnea (adult) (pediatric): Secondary | ICD-10-CM | POA: Diagnosis not present

## 2022-08-11 DIAGNOSIS — M109 Gout, unspecified: Secondary | ICD-10-CM | POA: Diagnosis not present

## 2022-08-11 DIAGNOSIS — E119 Type 2 diabetes mellitus without complications: Secondary | ICD-10-CM | POA: Diagnosis not present

## 2022-08-11 DIAGNOSIS — I10 Essential (primary) hypertension: Secondary | ICD-10-CM | POA: Diagnosis not present

## 2022-08-11 DIAGNOSIS — E7800 Pure hypercholesterolemia, unspecified: Secondary | ICD-10-CM | POA: Diagnosis not present

## 2022-08-11 DIAGNOSIS — Z0000 Encounter for general adult medical examination without abnormal findings: Secondary | ICD-10-CM | POA: Diagnosis not present

## 2022-08-11 DIAGNOSIS — G4733 Obstructive sleep apnea (adult) (pediatric): Secondary | ICD-10-CM | POA: Diagnosis not present

## 2022-08-11 DIAGNOSIS — E1169 Type 2 diabetes mellitus with other specified complication: Secondary | ICD-10-CM | POA: Diagnosis not present

## 2022-08-22 DIAGNOSIS — G4733 Obstructive sleep apnea (adult) (pediatric): Secondary | ICD-10-CM | POA: Diagnosis not present

## 2023-02-08 DIAGNOSIS — G4733 Obstructive sleep apnea (adult) (pediatric): Secondary | ICD-10-CM | POA: Diagnosis not present

## 2023-02-08 DIAGNOSIS — E1169 Type 2 diabetes mellitus with other specified complication: Secondary | ICD-10-CM | POA: Diagnosis not present

## 2023-02-08 DIAGNOSIS — I10 Essential (primary) hypertension: Secondary | ICD-10-CM | POA: Diagnosis not present

## 2023-02-08 DIAGNOSIS — E7800 Pure hypercholesterolemia, unspecified: Secondary | ICD-10-CM | POA: Diagnosis not present

## 2023-03-10 DIAGNOSIS — F4321 Adjustment disorder with depressed mood: Secondary | ICD-10-CM | POA: Diagnosis not present

## 2023-03-10 DIAGNOSIS — R519 Headache, unspecified: Secondary | ICD-10-CM | POA: Diagnosis not present

## 2023-03-17 ENCOUNTER — Emergency Department (HOSPITAL_BASED_OUTPATIENT_CLINIC_OR_DEPARTMENT_OTHER)
Admission: EM | Admit: 2023-03-17 | Discharge: 2023-03-17 | Disposition: A | Discharge status: Home / Self Care | Payer: BC Managed Care – PPO | Attending: Emergency Medicine | Admitting: Emergency Medicine

## 2023-03-17 ENCOUNTER — Emergency Department (HOSPITAL_BASED_OUTPATIENT_CLINIC_OR_DEPARTMENT_OTHER): Payer: BC Managed Care – PPO | Admitting: Radiology

## 2023-03-17 ENCOUNTER — Emergency Department (HOSPITAL_BASED_OUTPATIENT_CLINIC_OR_DEPARTMENT_OTHER): Payer: BC Managed Care – PPO

## 2023-03-17 ENCOUNTER — Encounter (HOSPITAL_BASED_OUTPATIENT_CLINIC_OR_DEPARTMENT_OTHER): Payer: Self-pay | Admitting: Emergency Medicine

## 2023-03-17 ENCOUNTER — Other Ambulatory Visit: Payer: Self-pay

## 2023-03-17 DIAGNOSIS — R1310 Dysphagia, unspecified: Secondary | ICD-10-CM | POA: Diagnosis not present

## 2023-03-17 DIAGNOSIS — R0600 Dyspnea, unspecified: Secondary | ICD-10-CM | POA: Diagnosis not present

## 2023-03-17 DIAGNOSIS — Z79899 Other long term (current) drug therapy: Secondary | ICD-10-CM | POA: Insufficient documentation

## 2023-03-17 DIAGNOSIS — I10 Essential (primary) hypertension: Secondary | ICD-10-CM | POA: Diagnosis not present

## 2023-03-17 DIAGNOSIS — R079 Chest pain, unspecified: Secondary | ICD-10-CM

## 2023-03-17 DIAGNOSIS — E119 Type 2 diabetes mellitus without complications: Secondary | ICD-10-CM | POA: Diagnosis not present

## 2023-03-17 DIAGNOSIS — J029 Acute pharyngitis, unspecified: Secondary | ICD-10-CM | POA: Insufficient documentation

## 2023-03-17 DIAGNOSIS — M542 Cervicalgia: Secondary | ICD-10-CM | POA: Diagnosis not present

## 2023-03-17 DIAGNOSIS — R0789 Other chest pain: Secondary | ICD-10-CM | POA: Insufficient documentation

## 2023-03-17 LAB — BASIC METABOLIC PANEL
Anion gap: 10 (ref 5–15)
BUN: 15 mg/dL (ref 6–20)
CO2: 24 mmol/L (ref 22–32)
Calcium: 9.7 mg/dL (ref 8.9–10.3)
Chloride: 104 mmol/L (ref 98–111)
Creatinine, Ser: 1.06 mg/dL (ref 0.61–1.24)
GFR, Estimated: >60 mL/min (ref >60)
Glucose, Bld: 105 mg/dL — ABNORMAL HIGH (ref 70–99)
Potassium: 3.6 mmol/L (ref 3.5–5.1)
Sodium: 138 mmol/L (ref 135–145)

## 2023-03-17 LAB — CBC
HCT: 39.8 % (ref 39.0–52.0)
Hemoglobin: 13.9 g/dL (ref 13.0–17.0)
MCH: 30.5 pg (ref 26.0–34.0)
MCHC: 34.9 g/dL (ref 30.0–36.0)
MCV: 87.5 fL (ref 80.0–100.0)
Platelets: 289 10*3/uL (ref 150–400)
RBC: 4.55 MIL/uL (ref 4.22–5.81)
RDW: 13.1 % (ref 11.5–15.5)
WBC: 11.1 10*3/uL — ABNORMAL HIGH (ref 4.0–10.5)
nRBC: 0 % (ref 0.0–0.2)

## 2023-03-17 LAB — TROPONIN I (HIGH SENSITIVITY)
Troponin I (High Sensitivity): 7 ng/L (ref <18)
Troponin I (High Sensitivity): 7 ng/L (ref <18)

## 2023-03-17 LAB — D-DIMER, QUANTITATIVE: D-Dimer, Quant: 0.51 ug/mL-FEU — ABNORMAL HIGH (ref 0.00–0.50)

## 2023-03-17 MED ORDER — CYCLOBENZAPRINE HCL 10 MG PO TABS: 10.0000 mg | ORAL_TABLET | Freq: Two times a day (BID) | ORAL | 0 refills | Status: AC | PRN

## 2023-03-17 MED ORDER — IOHEXOL 350 MG/ML SOLN
100.0000 mL | Freq: Once | INTRAVENOUS | Status: AC | PRN
  Administered 2023-03-17: 100 mL via INTRAVENOUS

## 2023-03-17 MED ORDER — LIDOCAINE VISCOUS HCL 2 % MT SOLN
15.0000 mL | Freq: Once | OROMUCOSAL | Status: AC
  Administered 2023-03-17: 15 mL via OROMUCOSAL
  Filled 2023-03-17: qty 15

## 2023-03-17 NOTE — Discharge Instructions (Signed)
Thank you for letting us take care of you today.  Overall, your workup was reassuring.  Your heart enzymes were negative.  The scans that we did were also negative.  As discussed, I believe there may be a musculoskeletal component of her chest pain so I am prescribing muscle relaxers for you to try at home.  Please take these only as prescribed in addition to ibuprofen 600 mg every 6-8 hours and/or Tylenol 1000 mg every 6 hours to help with pain.  Please follow-up with your primary next week and discuss your visit today as well as any continued symptoms.  If you develop any new or worsening symptoms such as severe chest pain, shortness of breath, fever, inability to swallow, or other new, concerning symptoms, please return to the nearest emergency department for reevaluation.

## 2023-03-17 NOTE — ED Triage Notes (Signed)
Pt arrives to ED with c/o chest pain and dysaphia. He notes he woke up this morning with neck pain that radiates to his chest and then he developed trouble swallowing. CP is right sided. He denies SOB, sore throat.

## 2023-03-17 NOTE — ED Notes (Signed)
Patient transported to CT 

## 2023-03-17 NOTE — ED Provider Notes (Signed)
[facility] Provider Note   CSN: [id number] Arrival date & time: 03/17/23  1715     History  Chief Complaint  Patient presents with   Chest Pain   Dysphagia   HPI Marco Torres is a 32 y.o. male with hypertension and diabetes presenting for chest pain and dysphagia.  Symptoms started this morning.  First with chest pain in the right upper quadrant of his chest that radiated to his neck.  Later developed trouble swallowing, sore throat and felt like "something stuck in his throat".  Denies shortness of breath.  Chest pain remains right-sided.  Feels sharp and is reproducible.  Nonexertional.  Patient is up-to-date on his vaccines.   Chest Pain      Home Medications Prior to Admission medications   Medication Sig Start Date End Date Taking? Authorizing Provider  amLODipine (NORVASC) 10 MG tablet Take 10 mg by mouth daily. 05/09/20   [provider]  amoxicillin (AMOXIL) 500 MG tablet Take 1 tablet (500 mg total) by mouth 2 (two) times daily. 11/10/15   Varney Biles, MD  Cetirizine HCl 10 MG CAPS Take 1 capsule (10 mg total) by mouth daily. 08/25/17   Charlann Lange, PA-C  colchicine 0.6 MG tablet Take 0.6 mg by mouth daily. 05/20/20   [provider]  fluticasone (FLONASE) 50 MCG/ACT nasal spray Place 2 sprays into both nostrils daily. 09/04/17   Palumbo, April, MD  ibuprofen (ADVIL) 800 MG tablet Take 1 tablet (800 mg total) by mouth every 8 (eight) hours as needed for mild pain. 06/08/20   Ward, Delice Bison, DO  lisinopril (PRINIVIL,ZESTRIL) 20 MG tablet Take 20 mg by mouth daily.    [provider]  losartan (COZAAR) 100 MG tablet Take 100 mg by mouth daily. 05/09/20   [provider]  Multiple Vitamins-Minerals (EMERGEN-C VITAMIN C PO) Take 1 packet by mouth daily.    [provider]  triamcinolone (NASACORT) 55 MCG/ACT AERO nasal inhaler Place 2 sprays into the nose daily. 08/25/17   Charlann Lange, PA-C      Allergies    Patient has no known allergies.    Review of Systems   Review of Systems  Cardiovascular:  Positive for chest pain.    Physical Exam   Vitals:   03/17/23 1735 03/17/23 1829  BP: (!) 170/102 (!) 167/110  Pulse: (!) 114 (!) 114  Resp: 16 19  Temp:  99.2 F (37.3 C)  SpO2: 94% 97%    CONSTITUTIONAL:  well-appearing, NAD NEURO:  Alert and oriented x 3, CN 3-12 grossly intact EYES:  eyes equal and reactive ENT/NECK:  Supple, no stridor, posterior oropharynx: Uvula is midline without swelling, no erythema or edema noted, no exudate CARDIO:  regular rate and rhythm, appears well-perfused  PULM:  No respiratory distress, CTAB GI/GU:  non-distended, soft MSK/SPINE:  No gross deformities, no edema, moves all extremities  SKIN:  no rash, atraumatic  *Additional and/or pertinent findings included in MDM below   ED Results / Procedures / Treatments   Labs (all labs ordered are listed, but only abnormal results are displayed) Labs Reviewed  BASIC METABOLIC PANEL - Abnormal; Notable for the following components:      Result Value   Glucose, Bld 105 (*)    All other components within normal limits  CBC - Abnormal; Notable for the following components:   WBC 11.1 (*)    All other components within normal limits  D-DIMER, QUANTITATIVE  TROPONIN I (HIGH SENSITIVITY)    EKG None  Radiology DG Chest 2 View  Result Date: 03/17/2023 CLINICAL DATA:  Chest pain and dyspnea. EXAM: CHEST - 2 VIEW COMPARISON:  January 17, 2007 FINDINGS: Cardiomediastinal silhouette is normal. Mediastinal contours appear intact. There is no evidence of focal airspace consolidation, pleural effusion or pneumothorax. Osseous structures are without acute abnormality. Soft tissues are grossly normal. IMPRESSION: No active cardiopulmonary disease. Electronically Signed   By: Fidela Salisbury M.D.   On: 03/17/2023 17:55    Procedures Procedures    Medications Ordered in  ED Medications  lidocaine (XYLOCAINE) 2 % viscous mouth solution 15 mL (has no administration in time range)    ED Course/ Medical Decision Making/ A&P                             Medical Decision Making Amount and/or Complexity of Data Reviewed Labs: ordered. Radiology: ordered.   32 year old well-appearing male presenting for chest pain and dysphagia.  Exam is unremarkable.  DDx includes ACS, PE, ingested foreign body, epiglottitis.  I have ordered soft tissue neck x-ray to assess for foreign body or evidence of inflammation or airway narrowing, will continue to trend troponins although have low suspicion for ACS.  Also given his tachycardia and chest pain will D-dimer to rule out low suspicion for PE. Signed out patient to Dallie Piles, PA.        Final Clinical Impression(s) / ED Diagnoses Final diagnoses:  Chest pain, unspecified type  Sore throat    Rx / DC Orders ED Discharge Orders     None         Harriet Pho, PA-C 03/17/23 1851    Davonna Belling, MD 03/17/23 [phone]

## 2023-03-17 NOTE — ED Notes (Signed)
Reviewed AVS with patient, patient expressed understanding of directions, denies further questions at this time. 

## 2023-03-17 NOTE — ED Provider Notes (Signed)
32 year old male with past medical history hypertension and diabetes here today for right upper chest pain, dysphagia, and sore throat.  Patient received an handoff from previous PA-C, Riki SheerJohn Robinson.  Please see his note for full HPI, physical exam, and assessment.  In summary, low suspicion for ACS with her stroke 7 and chest pain described as atypical.  However, patient remains persistently tachycardic.  Pending D-dimer, repeat troponin, and soft tissue neck x-ray as patient complaining of somewhat vague sore throat and dysphagia with questionable foreign body sensation when he swallows.  If these things are negative, plan is to discharge patient home.  Soft tissue neck x-ray negative.  Repeat troponin negative.  Patient did have minimally elevated D-dimer and a CT PE study was obtained.  This was also negative.  Patient remains tachycardic in the 110s.  He reports to me that he was also tachycardic at his last PCP appointment last week.  Patient endorses possible musculoskeletal component to his chest pain and/or possibility that it may be stress related.  Discussed plan to treat at home with over-the-counter Tylenol and ibuprofen as well as trial of muscle relaxants to see if patient gets relief of pain.  Patient has follow-up scheduled with PCP next week and will attend this appointment for reevaluation.  Patient given strict ED return precautions, all questions answered, and stable for discharge.    Richardson DoppGowens, Lether Tesch L, PA-C 03/17/23 2201    Benjiman CorePickering, Nathan, MD 03/20/23 1253

## 2023-03-24 DIAGNOSIS — G4733 Obstructive sleep apnea (adult) (pediatric): Secondary | ICD-10-CM | POA: Diagnosis not present

## 2023-03-24 DIAGNOSIS — R079 Chest pain, unspecified: Secondary | ICD-10-CM | POA: Diagnosis not present

## 2023-03-24 DIAGNOSIS — J3089 Other allergic rhinitis: Secondary | ICD-10-CM | POA: Diagnosis not present

## 2023-03-24 DIAGNOSIS — K219 Gastro-esophageal reflux disease without esophagitis: Secondary | ICD-10-CM | POA: Diagnosis not present
